# Patient Record
Sex: Male | Born: 1945 | Race: White | Hispanic: No | Smoking: Former smoker
Health system: Southern US, Community
[De-identification: ages and names within clinical notes are randomized; demographics above are authoritative.]

## PROBLEM LIST (undated history)

## (undated) DIAGNOSIS — C61 Malignant neoplasm of prostate: Secondary | ICD-10-CM

## (undated) DIAGNOSIS — I739 Peripheral vascular disease, unspecified: Secondary | ICD-10-CM

## (undated) DIAGNOSIS — I10 Essential (primary) hypertension: Secondary | ICD-10-CM

## (undated) HISTORY — DX: Essential (primary) hypertension: I10

## (undated) HISTORY — PX: PROSTATE SURGERY: SHX751

## (undated) HISTORY — PX: GREEN LIGHT LASER TURP (TRANSURETHRAL RESECTION OF PROSTATE: SHX6260

## (undated) HISTORY — PX: ANKLE SURGERY: SHX546

## (undated) HISTORY — PX: PROSTATE BIOPSY: SHX241

## (undated) HISTORY — PX: CYSTOSCOPY: SUR368

## (undated) HISTORY — PX: TONSILLECTOMY: SUR1361

---

## 2018-10-01 DIAGNOSIS — D485 Neoplasm of uncertain behavior of skin: Secondary | ICD-10-CM | POA: Diagnosis not present

## 2018-10-01 DIAGNOSIS — L821 Other seborrheic keratosis: Secondary | ICD-10-CM | POA: Diagnosis not present

## 2018-10-01 DIAGNOSIS — D224 Melanocytic nevi of scalp and neck: Secondary | ICD-10-CM | POA: Diagnosis not present

## 2018-10-01 DIAGNOSIS — D225 Melanocytic nevi of trunk: Secondary | ICD-10-CM | POA: Diagnosis not present

## 2018-10-15 ENCOUNTER — Encounter: Payer: Self-pay | Admitting: Family Medicine

## 2018-10-15 ENCOUNTER — Ambulatory Visit (INDEPENDENT_AMBULATORY_CARE_PROVIDER_SITE_OTHER): Payer: Self-pay | Admitting: Family Medicine

## 2018-10-15 ENCOUNTER — Other Ambulatory Visit: Payer: Self-pay

## 2018-10-15 VITALS — HR 64 | Resp 12 | Ht 76.0 in | Wt 175.0 lb

## 2018-10-15 DIAGNOSIS — I10 Essential (primary) hypertension: Secondary | ICD-10-CM | POA: Insufficient documentation

## 2018-10-15 MED ORDER — LOSARTAN POTASSIUM 100 MG PO TABS: 100.0000 mg | ORAL_TABLET | Freq: Every day | ORAL | 1 refills | Status: DC

## 2018-10-15 NOTE — Assessment & Plan Note (Signed)
He is not checking BP at home, strongly recommend to start doing so. No changes in current management. His granddaughter prefers not to bring him in for labs, afraid of possible exposure. We discussed some risk of not evaluating renal function and electrolytes for > 1 year. We will plan on checking BMP next visit. Follow-up in 3 to 4 months.

## 2018-10-15 NOTE — Progress Notes (Signed)
HPI:   Mr.Leviathan Janik is a 73 y.o. male, who wants to establish care. Due to mild hearing loss, granddaughter is helping with interrogation.  He recently moved from [place], 09/06/2018.  Former PCP: Dr. Sherrell Puller Last preventive routine visit: 2 years ago.  Chronic medical problems: Hypertension,mild hearing loss,and former smoker. He denies history of diabetes, hyperlipidemia, or CAD.  Hypertension diagnosed 10+ years ago. He is on losartan 100 mg daily. He is taking medication daily, no side effects reported.  Last blood work done 1 to 2 years ago.  Denies severe/frequent headache, visual changes, chest pain, dyspnea, palpitation, claudication, focal weakness, or edema.   Concerns today: Losartan refill.  Review of Systems  Constitutional: Negative for activity change, appetite change, fatigue and fever.  HENT: Negative for nosebleeds and sore throat.   Eyes: Negative for redness and visual disturbance.  Respiratory: Negative for cough, shortness of breath and wheezing.   Cardiovascular: Negative for chest pain, palpitations and leg swelling.  Gastrointestinal: Negative for abdominal pain, nausea and vomiting.  Genitourinary: Negative for decreased urine volume and hematuria.  Neurological: Negative for dizziness, weakness and headaches.    No current outpatient medications on file prior to visit.   No current facility-administered medications on file prior to visit.     Past Medical History:  Diagnosis Date  . Hypertension    No Known Allergies  Family History  Problem Relation Age of Onset  . Diabetes Neg Hx   . Heart disease Neg Hx   . Hyperlipidemia Neg Hx   . Cancer Neg Hx     Social History   Socioeconomic History  . Marital status: Unknown    Spouse name: Not on file  . Number of children: Not on file  . Years of education: Not on file  . Highest education level: Not on file  Occupational History  . Not on file  Social Needs   . Financial resource strain: Not on file  . Food insecurity:    Worry: Not on file    Inability: Not on file  . Transportation needs:    Medical: Not on file    Non-medical: Not on file  Tobacco Use  . Smoking status: Former Smoker    Types: Cigarettes    Start date: 07/25/1959    Last attempt to quit: 10/07/2015    Years since quitting: 3.0  . Smokeless tobacco: Never Used  Substance and Sexual Activity  . Alcohol use: Not Currently  . Drug use: Not Currently  . Sexual activity: Not Currently  Lifestyle  . Physical activity:    Days per week: Not on file    Minutes per session: Not on file  . Stress: Not on file  Relationships  . Social connections:    Talks on phone: Not on file    Gets together: Not on file    Attends religious service: Not on file    Active member of club or organization: Not on file    Attends meetings of clubs or organizations: Not on file    Relationship status: Not on file  Other Topics Concern  . Not on file  Social History Narrative  . Not on file    Vitals:   10/15/18 1203  Pulse: 64  Resp: 12    Body mass index is 21.3 kg/m.  Physical Exam  Constitutional: He is oriented to person, place, and time. He appears well-developed. No distress.  HENT:  Head: Normocephalic and atraumatic.  Eyes: Conjunctivae are normal.  Cardiovascular: Normal rate.  Respiratory: Effort normal. No respiratory distress.  Neurological: He is alert and oriented to person, place, and time.  No focal deficit appreciated.  Skin: No erythema.  Psychiatric: He has a normal mood and affect.  Well groomed, good eye contact.    ASSESSMENT AND PLAN:  Mr. Nithin was seen today for establish care.  Diagnoses and all orders for this visit:  Hypertension, essential, benign He is not checking BP at home, strongly recommend to start doing so. No changes in current management. His granddaughter prefers not to bring him in for labs, afraid of possible exposure. We  discussed some risk of not evaluating renal function and electrolytes for > 1 year. We will plan on checking BMP next visit. Follow-up in 3 to 4 months.    [facility] face to face visit 35 min. Reviewing Hx,med and side effects,and plan of care.    Return in about 4 months (around 02/14/2019) for htn.       Betty G. Martinique, MD  [facility]. [place] office.

## 2018-10-18 ENCOUNTER — Other Ambulatory Visit (INDEPENDENT_AMBULATORY_CARE_PROVIDER_SITE_OTHER): Payer: Medicare Other

## 2018-10-18 ENCOUNTER — Other Ambulatory Visit: Payer: Self-pay | Admitting: *Deleted

## 2018-10-18 ENCOUNTER — Other Ambulatory Visit: Payer: Self-pay

## 2018-10-18 DIAGNOSIS — I10 Essential (primary) hypertension: Secondary | ICD-10-CM

## 2018-10-18 LAB — BASIC METABOLIC PANEL
BUN: 14 mg/dL (ref 6–23)
CALCIUM: 9.6 mg/dL (ref 8.4–10.5)
CO2: 27 mEq/L (ref 19–32)
Chloride: 101 mEq/L (ref 96–112)
Creatinine, Ser: 1.13 mg/dL (ref 0.40–1.50)
GFR: 63.8 mL/min (ref >60.00)
Glucose, Bld: 136 mg/dL — ABNORMAL HIGH (ref 70–99)
Potassium: 4.4 mEq/L (ref 3.5–5.1)
Sodium: 138 mEq/L (ref 135–145)

## 2018-12-04 ENCOUNTER — Telehealth: Payer: Self-pay | Admitting: Family Medicine

## 2018-12-04 NOTE — Telephone Encounter (Signed)
Copied from [place] [id number]. Topic: Quick Communication - See Telephone Encounter >> Dec 04, 2018 11:21 AM Margot Ables wrote: CRM for notification. See Telephone encounter for: 12/04/18. Pt called stating his BP this morning was 164/94 and reports no symptoms or problems. Pt stating he often is around 175/90. He said that he thinks he needs something different than losartan 100mg . He said that he has been doing 2/day but is running out. RX is for 1/day. He is now taking 1/day not to run out. Please advise.

## 2018-12-05 NOTE — Telephone Encounter (Signed)
Patient scheduled ov with pcp on 12/06/2018 to discuss blood pressures and medication.

## 2018-12-06 ENCOUNTER — Other Ambulatory Visit: Payer: Self-pay

## 2018-12-06 ENCOUNTER — Encounter: Payer: Self-pay | Admitting: Family Medicine

## 2018-12-06 ENCOUNTER — Ambulatory Visit (INDEPENDENT_AMBULATORY_CARE_PROVIDER_SITE_OTHER): Payer: Medicare Other | Admitting: Family Medicine

## 2018-12-06 DIAGNOSIS — I10 Essential (primary) hypertension: Secondary | ICD-10-CM | POA: Diagnosis not present

## 2018-12-06 DIAGNOSIS — Z789 Other specified health status: Secondary | ICD-10-CM | POA: Diagnosis not present

## 2018-12-06 DIAGNOSIS — F1020 Alcohol dependence, uncomplicated: Secondary | ICD-10-CM | POA: Insufficient documentation

## 2018-12-06 MED ORDER — AMLODIPINE BESYLATE 5 MG PO TABS: 5.0000 mg | ORAL_TABLET | Freq: Every day | ORAL | 2 refills | Status: DC

## 2018-12-06 NOTE — Assessment & Plan Note (Signed)
Educated about the current recommendations in regard to alcohol consumption, max 8 ounces for men. Recommend stopping beer intake. Discussed adverse effects of alcohol, when taking more than recommended.

## 2018-12-06 NOTE — Progress Notes (Signed)
Virtual Visit via Video Note   I connected with Terry Morales 12/06/18 at  9:30 AM EDT by a video enabled telemedicine application and verified that I am speaking with the correct person using two identifiers.  Location patient: home Location provider:work  Persons participating in the virtual visit: patient, provider  I discussed the limitations of evaluation and management by telemedicine and the availability of in person appointments. The patient expressed understanding and agreed to proceed.   HPI: He is a 73 yo male last seen on 10/15/18. A few days ago he noted SBP high at 202, not sure about DBP. States that he started taking Losartan 100 mg daily and BP improved. Having 160-170's/80-90's. He is not sure about exacerbating factors.  He is following low-salt diet. He is not exercising regularly. He is tolerating medication well. He states that he used to take 3 different antihypertensive medications.  Denies severe/frequent headache, visual changes, chest pain, dyspnea, palpitation, claudication, focal weakness, or edema.  Lab Results  Component Value Date   CREATININE 1.13 10/18/2018   BUN 14 10/18/2018   NA 138 10/18/2018   K 4.4 10/18/2018   CL 101 10/18/2018   CO2 27 10/18/2018    When asked about alcohol intake, he states that on 10/05/18 he stopped "hard alcohol" intake. Currently he is drinking beer 7 oz can x 2-3/day and 3-4 "little" bottles of wine (13.2 oz each one). He has not noted abdominal pain, nausea, vomiting, or urinary symptoms.  ROS: See pertinent positives and negatives per HPI.  Past Medical History:  Diagnosis Date  . Hypertension     Family History  Problem Relation Age of Onset  . Diabetes Neg Hx   . Heart disease Neg Hx   . Hyperlipidemia Neg Hx   . Cancer Neg Hx     Social History   Socioeconomic History  . Marital status: Unknown    Spouse name: Not on file  . Number of children: Not on file  . Years of education: Not on file   . Highest education level: Not on file  Occupational History  . Not on file  Social Needs  . Financial resource strain: Not on file  . Food insecurity:    Worry: Not on file    Inability: Not on file  . Transportation needs:    Medical: Not on file    Non-medical: Not on file  Tobacco Use  . Smoking status: Former Smoker    Types: Cigarettes    Start date: 07/25/1959    Last attempt to quit: 10/07/2015    Years since quitting: 3.1  . Smokeless tobacco: Never Used  Substance and Sexual Activity  . Alcohol use: Not Currently  . Drug use: Not Currently  . Sexual activity: Not Currently  Lifestyle  . Physical activity:    Days per week: Not on file    Minutes per session: Not on file  . Stress: Not on file  Relationships  . Social connections:    Talks on phone: Not on file    Gets together: Not on file    Attends religious service: Not on file    Active member of club or organization: Not on file    Attends meetings of clubs or organizations: Not on file    Relationship status: Not on file  . Intimate partner violence:    Fear of current or ex partner: Not on file    Emotionally abused: Not on file    Physically abused:  Not on file    Forced sexual activity: Not on file  Other Topics Concern  . Not on file  Social History Narrative  . Not on file    Current Outpatient Medications:  .  amLODipine (NORVASC) 5 MG tablet, Take 1 tablet (5 mg total) by mouth daily., Disp: 30 tablet, Rfl: 2 .  losartan (COZAAR) 100 MG tablet, Take 1 tablet (100 mg total) by mouth daily., Disp: 90 tablet, Rfl: 1  EXAM:  VITALS per patient if applicable:BP (!) 564/33   Pulse 72   Resp 12   GENERAL: alert, oriented, appears well and in no acute distress  HEENT: atraumatic, conjunttiva clear, no obvious facial abnormalities on inspection.  NECK: Otherwise normal movements of the head and neck  LUNGS: on inspection no signs of respiratory distress, breathing rate appears normal, no  obvious gross SOB, gasping or wheezing  CV: no obvious cyanosis  MS: moves all visible extremities without noticeable abnormality  PSYCH/NEURO: pleasant and cooperative, no obvious depression or anxiety, speech and thought processing grossly intact  ASSESSMENT AND PLAN:  Discussed the following assessment and plan:  Hypertension, essential, benign Poorly controlled. Continue losartan 100 mg daily. Amlodipine 2.5 mg started today, if BP still => 140/90 in 2 weeks, he was instructed to increase amlodipine dose to 5 mg. We discussed some side effects. Clearly instructed about warning signs. Continue low-salt diet. Follow-up in 6 weeks.  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 Recommend stopping beer intake. Discussed adverse effects of alcohol, when taking more than recommended.     I discussed the assessment and treatment plan with the patient. He was provided an opportunity to ask questions and all were answered. The patient agreed with the plan and demonstrated an understanding of the instructions.   The patient was advised to call back or seek an in-person evaluation if the symptoms worsen or if the condition fails to improve as anticipated.  No follow-ups on file.    Betty Martinique, MD

## 2018-12-06 NOTE — Assessment & Plan Note (Signed)
Poorly controlled. Continue losartan 100 mg daily. Amlodipine 2.5 mg started today, if BP still => 140/90 in 2 weeks, he was instructed to increase amlodipine dose to 5 mg. We discussed some side effects. Clearly instructed about warning signs. Continue low-salt diet. Follow-up in 6 weeks.

## 2018-12-11 ENCOUNTER — Emergency Department (HOSPITAL_COMMUNITY)
Admission: EM | Admit: 2018-12-11 | Discharge: 2018-12-12 | Disposition: A | Discharge status: Home / Self Care | Payer: Medicare Other | Attending: Emergency Medicine | Admitting: Emergency Medicine

## 2018-12-11 ENCOUNTER — Other Ambulatory Visit: Payer: Self-pay

## 2018-12-11 ENCOUNTER — Encounter (HOSPITAL_COMMUNITY): Payer: Self-pay | Admitting: Emergency Medicine

## 2018-12-11 ENCOUNTER — Ambulatory Visit: Payer: Self-pay

## 2018-12-11 DIAGNOSIS — Z87891 Personal history of nicotine dependence: Secondary | ICD-10-CM | POA: Insufficient documentation

## 2018-12-11 DIAGNOSIS — I10 Essential (primary) hypertension: Secondary | ICD-10-CM | POA: Insufficient documentation

## 2018-12-11 DIAGNOSIS — Z79899 Other long term (current) drug therapy: Secondary | ICD-10-CM | POA: Diagnosis not present

## 2018-12-11 DIAGNOSIS — R310 Gross hematuria: Secondary | ICD-10-CM | POA: Insufficient documentation

## 2018-12-11 DIAGNOSIS — R319 Hematuria, unspecified: Secondary | ICD-10-CM | POA: Diagnosis present

## 2018-12-11 LAB — URINALYSIS, ROUTINE W REFLEX MICROSCOPIC

## 2018-12-11 LAB — COMPREHENSIVE METABOLIC PANEL
ALT: 15 U/L (ref 0–44)
AST: 13 U/L — ABNORMAL LOW (ref 15–41)
Albumin: 3.6 g/dL (ref 3.5–5.0)
Alkaline Phosphatase: 75 U/L (ref 38–126)
Anion gap: 10 (ref 5–15)
BUN: 11 mg/dL (ref 8–23)
CO2: 24 mmol/L (ref 22–32)
Calcium: 9.3 mg/dL (ref 8.9–10.3)
Chloride: 102 mmol/L (ref 98–111)
Creatinine, Ser: 1.02 mg/dL (ref 0.61–1.24)
Glucose, Bld: 111 mg/dL — ABNORMAL HIGH (ref 70–99)
Potassium: 4.2 mmol/L (ref 3.5–5.1)
Sodium: 136 mmol/L (ref 135–145)
Total Bilirubin: 0.8 mg/dL (ref 0.3–1.2)
Total Protein: 6.5 g/dL (ref 6.5–8.1)

## 2018-12-11 LAB — CBC WITH DIFFERENTIAL/PLATELET
Abs Immature Granulocytes: 0.04 10*3/uL (ref 0.00–0.07)
Basophils Absolute: 0.1 10*3/uL (ref 0.0–0.1)
Basophils Relative: 1 %
Eosinophils Absolute: 0.2 10*3/uL (ref 0.0–0.5)
Eosinophils Relative: 3 %
HCT: 39 % (ref 39.0–52.0)
Hemoglobin: 12.9 g/dL — ABNORMAL LOW (ref 13.0–17.0)
Immature Granulocytes: 1 %
Lymphocytes Relative: 20 %
Lymphs Abs: 1.4 10*3/uL (ref 0.7–4.0)
MCH: 31.6 pg (ref 26.0–34.0)
MCHC: 33.1 g/dL (ref 30.0–36.0)
MCV: 95.6 fL (ref 80.0–100.0)
Monocytes Absolute: 0.6 10*3/uL (ref 0.1–1.0)
Monocytes Relative: 9 %
Neutro Abs: 4.7 10*3/uL (ref 1.7–7.7)
Neutrophils Relative %: 66 %
Platelets: 362 10*3/uL (ref 150–400)
RBC: 4.08 MIL/uL — ABNORMAL LOW (ref 4.22–5.81)
RDW: 11.9 % (ref 11.5–15.5)
WBC: 7 10*3/uL (ref 4.0–10.5)
nRBC: 0 % (ref 0.0–0.2)

## 2018-12-11 LAB — URINALYSIS, MICROSCOPIC (REFLEX): RBC / HPF: >50 RBC/hpf (ref 0–5)

## 2018-12-11 MED ORDER — LIDOCAINE HCL (PF) 1 % IJ SOLN
5.0000 mL | Freq: Once | INTRAMUSCULAR | Status: AC
  Administered 2018-12-11: 5 mL via INTRADERMAL
  Filled 2018-12-11: qty 5

## 2018-12-11 MED ORDER — CEPHALEXIN 500 MG PO CAPS: 500.0000 mg | ORAL_CAPSULE | Freq: Three times a day (TID) | ORAL | 0 refills | Status: DC

## 2018-12-11 MED ORDER — CEFTRIAXONE SODIUM 1 G IJ SOLR
1.0000 g | Freq: Once | INTRAMUSCULAR | Status: AC
  Administered 2018-12-11: 1 g via INTRAMUSCULAR
  Filled 2018-12-11: qty 10

## 2018-12-11 NOTE — ED Provider Notes (Signed)
TIME SEEN: 11:17 PM  CHIEF COMPLAINT: Gross hematuria  HPI: Patient is a 73 year old male with history of hypertension who presents to the emergency department with gross hematuria.  States this has been ongoing for 2 days.  He is passing clots but feels he is able to empty his bladder.  No flank pain, abdominal pain.  No fevers or chills.  He is not on blood thinners.  He has had this happen once before after a prostate procedure.  No history of prostate or bladder cancer.  No dysuria.  No discharge.  ROS: See HPI Constitutional: no fever  Eyes: no drainage  ENT: no runny nose   Cardiovascular:  no chest pain  Resp: no SOB  GI: no vomiting GU: no dysuria Integumentary: no rash  Allergy: no hives  Musculoskeletal: no leg swelling  Neurological: no slurred speech ROS otherwise negative  PAST MEDICAL HISTORY/PAST SURGICAL HISTORY:  Past Medical History:  Diagnosis Date  . Hypertension     MEDICATIONS:  Prior to Admission medications   Medication Sig Start Date End Date Taking? Authorizing Provider  amLODipine (NORVASC) 5 MG tablet Take 1 tablet (5 mg total) by mouth daily. 12/06/18   Martinique, Betty G, MD  losartan (COZAAR) 100 MG tablet Take 1 tablet (100 mg total) by mouth daily. 10/15/18   Martinique, Betty G, MD    ALLERGIES:  No Known Allergies  SOCIAL HISTORY:  Social History   Tobacco Use  . Smoking status: Former Smoker    Types: Cigarettes    Start date: 07/25/1959    Last attempt to quit: 10/07/2015    Years since quitting: 3.1  . Smokeless tobacco: Never Used  Substance Use Topics  . Alcohol use: Not Currently    FAMILY HISTORY: Family History  Problem Relation Age of Onset  . Diabetes Neg Hx   . Heart disease Neg Hx   . Hyperlipidemia Neg Hx   . Cancer Neg Hx     EXAM: BP (!) 203/89 (BP Location: Right Arm) Comment: Reported to RN  Pulse 71   Temp (!) 97.5 F (36.4 C) (Oral)   Resp 18   Ht 6' (1.829 m)   Wt 81.6 kg   SpO2 100%   BMI 24.41 kg/m   CONSTITUTIONAL: Alert and oriented and responds appropriately to questions. Well-appearing; well-nourished, elderly but appears younger than stated age, in no distress, afebrile, nontoxic, well-hydrated HEAD: Normocephalic EYES: Conjunctivae clear, pupils appear equal, EOMI ENT: normal nose; moist mucous membranes NECK: Supple, no meningismus, no nuchal rigidity, no LAD  CARD: RRR RESP: Normal chest excursion without splinting or tachypnea; no hypoxia or respiratory distress, speaking full sentences ABD/GI: Normal bowel sounds; non-distended; soft, non-tender, no rebound, no guarding, no peritoneal signs, no hepatosplenomegaly BACK:  The back appears normal and is non-tender to palpation, there is no CVA tenderness EXT: Normal ROM in all joints; no major deformity noted SKIN: Normal color for age and race; warm; no rash on exposed skin NEURO: Moves all extremities equally PSYCH: The patient's mood and manner are appropriate. Grooming and personal hygiene are appropriate.  MEDICAL DECISION MAKING: Patient here with painless hematuria.  Labs obtained are unremarkable.  Hemoglobin of 12.9.  Normal creatinine.  No leukocytosis.  He denies any pain.  I have low suspicion for kidney stone.  His urine shows gross blood and many bacteria.  Microscopic exam unable to be performed given urine is grossly bloody.  We will send urine culture but discussed with patient that this may  be a urinary tract infection given many bacteria present.  Will give IM Rocephin here and discharged with Keflex.  He has no previous urine cultures for comparison.  He states he feels that he is able to empty his bladder fine on his own at this time and I do not feel he needs a Foley catheter and does not need to be irrigated.  We have discussed the importance of hydration and frequent emptying of the bladder.  We have discussed that if he has difficulty emptying his bladder secondary to clots that he may need to return to the  emergency department for Foley catheter, irrigation and possible admission.  I recommended follow-up with his primary care physician to recheck his urine after antibiotics complete.  He is also hypertensive here but asymptomatic.  He reports compliance with his medications.  We will have him follow-up his primary doctor to have this rechecked as well.  Also gave urology outpatient follow-up information if symptoms continue despite antibiotics as patient may benefit from cystoscopy.  Patient verbalized understanding and is comfortable with this plan.  At this time, I do not feel there is any life-threatening condition present. I have reviewed and discussed all results (EKG, imaging, lab, urine as appropriate) and exam findings with patient/family. I have reviewed nursing notes and appropriate previous records.  I feel the patient is safe to be discharged home without further emergent workup and can continue workup as an outpatient as needed. Discussed usual and customary return precautions. Patient/family verbalize understanding and are comfortable with this plan.  Outpatient follow-up has been provided as needed. All questions have been answered.      Ward, Delice Bison, DO 12/11/18 2340

## 2018-12-11 NOTE — Telephone Encounter (Signed)
Patient called and says since last night, he's been passing blood clots in his urine. He says he has to go about every 20 minutes work the clots out. He says there is red blood after the clots come out. He denies pain, but says it's an uncomfortable feeling until the clots come out. He denies fever, flank pain. I advised the office is closed and someone from the office will call tomorrow to schedule a visit, care advice given, patient verbalized understanding.  Reason for Disposition . Pain or burning with passing urine  Answer Assessment - Initial Assessment Questions 1. COLOR of URINE: "Describe the color of the urine."  (e.g., tea-colored, pink, red, blood clots, bloody)     Blood clots, red 2. ONSET: "When did the bleeding start?"      Last night 3. EPISODES: "How many times has there been blood in the urine?" or "How many times today?"     Every 20 minutes when feel like have to go all throughout the day 4. PAIN with URINATION: "Is there any pain with passing your urine?" If so, ask: "How bad is the pain?"  (Scale 1-10; or mild, moderate, severe)    - MILD - complains slightly about urination hurting    - MODERATE - interferes with normal activities      - SEVERE - excruciating, unwilling or unable to urinate because of the pain      No pain, but uncomfortable until the clots come out 5. FEVER: "Do you have a fever?" If so, ask: "What is your temperature, how was it measured, and when did it start?"     No 6. ASSOCIATED SYMPTOMS: "Are you passing urine more frequently than usual?"     Yes 7. OTHER SYMPTOMS: "Do you have any other symptoms?" (e.g., back/flank pain, abdominal pain, vomiting)     Frequency 8. PREGNANCY: "Is there any chance you are pregnant?" "When was your last menstrual period?"     N/A  Protocols used: URINE - BLOOD IN-A-AH

## 2018-12-11 NOTE — ED Triage Notes (Signed)
Pt to ED with c/o hematuria since last pm with clots.  St's he has problems urinating at times

## 2018-12-12 LAB — URINE CULTURE: Culture: NO GROWTH

## 2018-12-12 NOTE — Telephone Encounter (Signed)
Patient went to ER last night at 11 pm. Message sent to Dr. Martinique for review.

## 2018-12-20 ENCOUNTER — Ambulatory Visit (INDEPENDENT_AMBULATORY_CARE_PROVIDER_SITE_OTHER): Payer: Medicare Other | Admitting: Family Medicine

## 2018-12-20 ENCOUNTER — Encounter: Payer: Self-pay | Admitting: Family Medicine

## 2018-12-20 ENCOUNTER — Other Ambulatory Visit: Payer: Self-pay

## 2018-12-20 VITALS — BP 150/80 | HR 76 | Temp 98.8°F | Resp 12 | Ht 72.0 in | Wt 188.4 lb

## 2018-12-20 DIAGNOSIS — R310 Gross hematuria: Secondary | ICD-10-CM

## 2018-12-20 DIAGNOSIS — I10 Essential (primary) hypertension: Secondary | ICD-10-CM

## 2018-12-20 LAB — CBC
HCT: 33.7 % — ABNORMAL LOW (ref 39.0–52.0)
Hemoglobin: 11.8 g/dL — ABNORMAL LOW (ref 13.0–17.0)
MCHC: 34.9 g/dL (ref 30.0–36.0)
MCV: 93 fl (ref 78.0–100.0)
Platelets: 415 10*3/uL — ABNORMAL HIGH (ref 150.0–400.0)
RBC: 3.62 Mil/uL — ABNORMAL LOW (ref 4.22–5.81)
RDW: 12.8 % (ref 11.5–15.5)
WBC: 7.6 10*3/uL (ref 4.0–10.5)

## 2018-12-20 LAB — PSA: PSA: 8.41 ng/mL — ABNORMAL HIGH (ref 0.10–4.00)

## 2018-12-20 NOTE — Progress Notes (Signed)
HPI:   Terry Morales is a 73 y.o. male, who is here today to follow on recent ER visit.  He was last seen, virtual visit, 12/06/2018. He was recently evaluated in the ER because of gross hematuria, 12/11/2018. He was discharged on Keflex, completed treatment.  He is reporting history of prostate surgery about 6 to 7 years ago,not sure about patology result.  He states that since prostate surgery occasionally he sees "small clots" in urine. He has not identified exacerbating or alleviating factors.  He denies dysuria, changes in urinary frequency, or nocturia. Negative for rectal pain.  Former smoker and Hx of high alcohol consumption.  Denies nose/gum bleeding,blood in stool,melena,or easier bruising. Negative for fatigue,changes in daily activities, or abnormal wt loss.   Hypertension, currently he is on amlodipine 2.5 mg daily and losartan 100 mg daily,the former one was added last OV. BP readings still elevated: 150-160's/70-80's,see scan BP reading log.   Denies severe/frequent headache, visual changes, chest pain, dyspnea, palpitation, claudication, focal weakness, or edema.  Lab Results  Component Value Date   CREATININE 1.02 12/11/2018   BUN 11 12/11/2018   NA 136 12/11/2018   K 4.2 12/11/2018   CL 102 12/11/2018   CO2 24 12/11/2018    Review of Systems  Constitutional: Negative for appetite change, chills and fever.  Gastrointestinal: Negative for abdominal pain, nausea and vomiting.       Changes in bowel habits.  Genitourinary: Positive for hematuria.  Musculoskeletal: Positive for gait problem.  Neurological: Negative for dizziness and syncope.  Rest pertinent positives and negatives in HPI.  Current Outpatient Medications on File Prior to Visit  Medication Sig Dispense Refill  . amLODipine (NORVASC) 5 MG tablet Take 1 tablet (5 mg total) by mouth daily. 30 tablet 2  . losartan (COZAAR) 100 MG tablet Take 1 tablet (100 mg total) by mouth daily.  90 tablet 1   No current facility-administered medications on file prior to visit.      Past Medical History:  Diagnosis Date  . Hypertension    No Known Allergies  Social History   Socioeconomic History  . Marital status: Widowed    Spouse name: Not on file  . Number of children: Not on file  . Years of education: Not on file  . Highest education level: Not on file  Occupational History  . Not on file  Social Needs  . Financial resource strain: Not on file  . Food insecurity:    Worry: Not on file    Inability: Not on file  . Transportation needs:    Medical: Not on file    Non-medical: Not on file  Tobacco Use  . Smoking status: Former Smoker    Types: Cigarettes    Start date: 07/25/1959    Last attempt to quit: 10/07/2015    Years since quitting: 3.2  . Smokeless tobacco: Never Used  Substance and Sexual Activity  . Alcohol use: Yes  . Drug use: Not Currently  . Sexual activity: Not Currently  Lifestyle  . Physical activity:    Days per week: Not on file    Minutes per session: Not on file  . Stress: Not on file  Relationships  . Social connections:    Talks on phone: Not on file    Gets together: Not on file    Attends religious service: Not on file    Active member of club or organization: Not on file    Attends  meetings of clubs or organizations: Not on file    Relationship status: Not on file  Other Topics Concern  . Not on file  Social History Narrative  . Not on file    Vitals:   12/20/18 1140  BP: (!) 150/80  Pulse: 76  Resp: 12  Temp: 98.8 F (37.1 C)  SpO2: 98%   Body mass index is 25.55 kg/m.   Physical Exam  Nursing note and vitals reviewed. Constitutional: He is oriented to person, place, and time. He appears well-developed and well-nourished. No distress.  HENT:  Head: Normocephalic and atraumatic.  Mouth/Throat: Oropharynx is clear and moist and mucous membranes are normal.  Eyes: Conjunctivae are normal.  Cardiovascular:  Normal rate and regular rhythm.  Murmur (SEM I/VI RUSB) heard. Pulses:      Dorsalis pedis pulses are 2+ on the right side and 2+ on the left side.  Respiratory: Effort normal and breath sounds normal. No respiratory distress.  GI: Soft. He exhibits no mass. There is no hepatomegaly. There is no abdominal tenderness. There is no CVA tenderness.  Musculoskeletal:        General: Edema (2+ pitting LE edema,bilateral.) present.  Lymphadenopathy:    He has no cervical adenopathy.  Neurological: He is alert and oriented to person, place, and time. He has normal strength. No cranial nerve deficit. Gait abnormal.  Mildly unstable gait without assistance.  Skin: Skin is warm. No rash noted. No erythema.  Psychiatric: He has a normal mood and affect.  Well groomed, good eye contact.    ASSESSMENT AND PLAN:  Mr. Young was seen today for er follow-up.  Diagnoses and all orders for this visit: Lab Results  Component Value Date   WBC 7.6 12/20/2018   HGB 11.8 (L) 12/20/2018   HCT 33.7 (L) 12/20/2018   MCV 93.0 12/20/2018   PLT 415.0 (H) 12/20/2018   Lab Results  Component Value Date   PSA 8.41 (H) 12/20/2018     Gross hematuria Possible etiologies discussed. UCx not growth. Clearly instructed about warning signs. Urology evaluation reccommended.  -     PSA -     CBC -     Ambulatory referral to Urology   Hypertension, essential, benign BP readings still elevated. Recommend increasing amlodipine dose from 2.5 mg to 5 mg. Continue losartan 100 mg daily. Continue monitoring BP regularly. Low-salt diet recommended. Follow-up in 3 to 4 months.   Return in about 4 months (around 04/22/2019) for HTN.    Macyn Remmert G. Martinique, MD  [facility]. [place] office.

## 2018-12-20 NOTE — Patient Instructions (Signed)
A few things to remember from today's visit:   Hypertension, essential, benign  Gross hematuria - Plan: PSA, CBC, Ambulatory referral to Urology  Amlodipine increased from 2.5 mg to 5 mg. No changes in losartan. Continue monitoring blood pressure.  And urology appointment will be arranged.  Please be sure medication list is accurate. If a new problem present, please set up appointment sooner than planned today.

## 2018-12-20 NOTE — Assessment & Plan Note (Signed)
BP readings still elevated. Recommend increasing amlodipine dose from 2.5 mg to 5 mg. Continue losartan 100 mg daily. Continue monitoring BP regularly. Low-salt diet recommended. Follow-up in 3 to 4 months.

## 2018-12-30 ENCOUNTER — Telehealth: Payer: Self-pay | Admitting: *Deleted

## 2018-12-30 DIAGNOSIS — R3912 Poor urinary stream: Secondary | ICD-10-CM | POA: Diagnosis not present

## 2018-12-30 DIAGNOSIS — R310 Gross hematuria: Secondary | ICD-10-CM | POA: Diagnosis not present

## 2018-12-30 NOTE — Telephone Encounter (Signed)
Patient called after hours line 12/28/2018. Patient c/o  rash that is spreading everywhere. It's itchy. It started about 3 weeks ago. They're just pink little dots.   Clinic RN called to f/u with patient. Per daughter patient is taking Benadryl and that is seeming to help with rash and itching.

## 2018-12-31 DIAGNOSIS — R310 Gross hematuria: Secondary | ICD-10-CM | POA: Diagnosis not present

## 2018-12-31 DIAGNOSIS — K5790 Diverticulosis of intestine, part unspecified, without perforation or abscess without bleeding: Secondary | ICD-10-CM | POA: Diagnosis not present

## 2019-01-06 ENCOUNTER — Encounter: Payer: Self-pay | Admitting: Family Medicine

## 2019-01-06 ENCOUNTER — Ambulatory Visit: Payer: Self-pay | Admitting: *Deleted

## 2019-01-06 ENCOUNTER — Ambulatory Visit (INDEPENDENT_AMBULATORY_CARE_PROVIDER_SITE_OTHER): Payer: Medicare Other | Admitting: Family Medicine

## 2019-01-06 ENCOUNTER — Other Ambulatory Visit: Payer: Self-pay

## 2019-01-06 VITALS — BP 138/64 | HR 73 | Temp 97.3°F | Wt 189.4 lb

## 2019-01-06 DIAGNOSIS — L309 Dermatitis, unspecified: Secondary | ICD-10-CM | POA: Diagnosis not present

## 2019-01-06 MED ORDER — TRIAMCINOLONE ACETONIDE 0.1 % EX OINT: 1.0000 "application " | TOPICAL_OINTMENT | Freq: Two times a day (BID) | CUTANEOUS | 0 refills | Status: DC

## 2019-01-06 NOTE — Telephone Encounter (Signed)
Rash- benadryl not helping- Patient had reached out on 6/8 with mild rash- at this point patient has open sores- they are not going away. Call to office to schedule appointment to discuss appointment.  Reason for Disposition . Taking new prescription medicine  (Exceptions: finished taking new prescription antibiotic OR questions about flushing from niacin)  Answer Assessment - Initial Assessment Questions 1. APPEARANCE of RASH: "Describe the rash." (e.g., spots, blisters, raised areas, skin peeling, scaly)     Red - open sores 2. SIZE: "How big are the spots?" (e.g., tip of pen, eraser, coin; inches, centimeters)     spotty areas 3. LOCATION: "Where is the rash located?"     Shoulder and chest- now on legs 4. COLOR: "What color is the rash?" (Note: It is difficult to assess rash color in people with darker-colored skin. When this situation occurs, simply ask the caller to describe what they see.)     red 5. ONSET: "When did the rash begin?"     3-4 weeks 6. FEVER: "Do you have a fever?" If so, ask: "What is your temperature, how was it measured, and when did it start?"     no 7. ITCHING: "Does the rash itch?" If so, ask: "How bad is the itch?" (Scale 1-10; or mild, moderate, severe)     Yes- severe 8. CAUSE: "What do you think is causing the rash?"     Possible medication reaction 9. NEW MEDICATION: "What new medication are you taking?" (e.g., name of antibiotic) "When did you start taking this medication?".     Possible response to Amlodipine 10. OTHER SYMPTOMS: "Do you have any other symptoms?" (e.g., sore throat, fever, joint pain)      no 11. PREGNANCY: "Is there any chance you are pregnant?" "When was your last menstrual period?"       n/a  Protocols used: RASH - WIDESPREAD ON DRUGS-A-AH

## 2019-01-06 NOTE — Progress Notes (Signed)
   Subjective:    Patient ID: Terry Morales, male    DOB: [birth year], 73 y.o.   MRN: [id number]  HPI Here for an itchy rash that appeared one month ago. No recent medication changes. He feels fine in general. He is being evaluated by Urology for an elevated PSA and recent hematuria. A recent urine culture was negative.    Review of Systems  Constitutional: Negative.   Respiratory: Negative.   Cardiovascular: Negative.   Skin: Positive for rash.       Objective:   Physical Exam Constitutional:      Appearance: Normal appearance.  Cardiovascular:     Rate and Rhythm: Normal rate and regular rhythm.     Pulses: Normal pulses.     Heart sounds: Normal heart sounds.  Pulmonary:     Effort: Pulmonary effort is normal.     Breath sounds: Normal breath sounds.  Skin:    Comments: There are wide spread areas if macular red scaly patches of skin on the arms, thighs, and trunk    Neurological:     Mental Status: He is alert.           Assessment & Plan:  Eczema, treat with Triamcinolone ointment.  Alysia Penna, MD

## 2019-01-06 NOTE — Telephone Encounter (Signed)
Pt scheduled for office visit

## 2019-01-13 ENCOUNTER — Telehealth: Payer: Self-pay | Admitting: Family Medicine

## 2019-01-13 DIAGNOSIS — R310 Gross hematuria: Secondary | ICD-10-CM | POA: Diagnosis not present

## 2019-01-13 NOTE — Telephone Encounter (Signed)
Patient's rash is getting better slowly, however steroid cream is greasy and problematic so he is wanting a call back to discuss his options.  Patient is wondering if it was possible to take a pill for the rash.

## 2019-01-15 NOTE — Telephone Encounter (Signed)
I do not think we have talked recently about rash but oral steroids are not indicated to treat chronic pruritic rash or acute localized rash because of side effects.  Thanks, BJ

## 2019-01-15 NOTE — Telephone Encounter (Signed)
Spoke with [name] and gave recommendations per Dr. Martinique. Lori scheduled virtual visit to discuss rash on 01/16/2019. Nothing further needed at this time.

## 2019-01-16 ENCOUNTER — Ambulatory Visit (INDEPENDENT_AMBULATORY_CARE_PROVIDER_SITE_OTHER): Payer: Medicare Other | Admitting: Family Medicine

## 2019-01-16 ENCOUNTER — Encounter: Payer: Self-pay | Admitting: Family Medicine

## 2019-01-16 ENCOUNTER — Other Ambulatory Visit: Payer: Self-pay

## 2019-01-16 DIAGNOSIS — L298 Other pruritus: Secondary | ICD-10-CM | POA: Diagnosis not present

## 2019-01-16 MED ORDER — PREDNISONE 20 MG PO TABS: ORAL_TABLET | ORAL | 0 refills | Status: AC

## 2019-01-16 MED ORDER — CLOBETASOL PROPIONATE 0.05 % EX CREA: 1.0000 "application " | TOPICAL_CREAM | Freq: Two times a day (BID) | CUTANEOUS | 1 refills | Status: DC

## 2019-01-16 NOTE — Progress Notes (Signed)
Virtual Visit via Video Note   I connected with Terry Morales and his daughter on 01/16/19 at 11:30 AM EDT by a video enabled telemedicine application and verified that I am speaking with the correct person using two identifiers.  Location patient: home Location provider:home office Persons participating in the virtual visit: patient, provider  I discussed the limitations of evaluation and management by telemedicine and the availability of in person appointments. The patient expressed understanding and agreed to proceed.   HPI: Terry Morales is a 73 yo male with Hx of HTN who is c/o persistent pruritic rash for the past month or so. He denies any new medication, soap, or body product. His daughter states that he was using 2 different detergents,new brand. Rash was initially on abdomen,upper chest,UE's and LE's.  Chest and abdomen greatly better, he sees some lesions intermittently. No known insect bite or outdoor exposures to plants. No sick contact. No Hx of eczema or similar rash in the past.  He was prescribed Triamcinolone oint but he is annoyed by greasy texture. Rash is not tender and no associated edema.  Her daughter mentions that she has chiggers in her yard, she does not think this is causing this rash.  He denies oral lesions/edema,cough, wheezing, dyspnea, abdominal pain, nausea, or vomiting.  ROS: See pertinent positives and negatives per HPI.  Past Medical History:  Diagnosis Date  . Hypertension     Past Surgical History:  Procedure Laterality Date  . PROSTATE SURGERY    . TONSILLECTOMY      Family History  Problem Relation Age of Onset  . Diabetes Neg Hx   . Heart disease Neg Hx   . Hyperlipidemia Neg Hx   . Cancer Neg Hx     Social History   Socioeconomic History  . Marital status: Widowed    Spouse name: Not on file  . Number of children: Not on file  . Years of education: Not on file  . Highest education level: Not on file  Occupational History  .  Not on file  Social Needs  . Financial resource strain: Not on file  . Food insecurity    Worry: Not on file    Inability: Not on file  . Transportation needs    Medical: Not on file    Non-medical: Not on file  Tobacco Use  . Smoking status: Former Smoker    Types: Cigarettes    Start date: 07/25/1959    Quit date: 10/07/2015    Years since quitting: 3.2  . Smokeless tobacco: Never Used  Substance and Sexual Activity  . Alcohol use: Yes  . Drug use: Not Currently  . Sexual activity: Not Currently  Lifestyle  . Physical activity    Days per week: Not on file    Minutes per session: Not on file  . Stress: Not on file  Relationships  . Social Herbalist on phone: Not on file    Gets together: Not on file    Attends religious service: Not on file    Active member of club or organization: Not on file    Attends meetings of clubs or organizations: Not on file    Relationship status: Not on file  . Intimate partner violence    Fear of current or ex partner: Not on file    Emotionally abused: Not on file    Physically abused: Not on file    Forced sexual activity: Not on file  Other Topics  Concern  . Not on file  Social History Narrative  . Not on file     Current Outpatient Medications:  .  amLODipine (NORVASC) 5 MG tablet, Take 1 tablet (5 mg total) by mouth daily., Disp: 30 tablet, Rfl: 2 .  clobetasol cream (TEMOVATE) 1.02 %, Apply 1 application topically 2 (two) times daily. 14 days, Disp: 45 g, Rfl: 1 .  losartan (COZAAR) 100 MG tablet, Take 1 tablet (100 mg total) by mouth daily., Disp: 90 tablet, Rfl: 1 .  predniSONE (DELTASONE) 20 MG tablet, 2 tabs for 5 days, 1 tabs for 3 days, 1/2  tabs for 3 days. Take tables together with breakfast., Disp: 15 tablet, Rfl: 0  EXAM:  VITALS per patient if applicable:N/A  GENERAL: alert, oriented, appears well and in no acute distress  HEENT: atraumatic,normocephalic, conjunctiva clear, no obvious facial  abnormalities on inspection.  LUNGS: on inspection no signs of respiratory distress, breathing rate appears normal, no obvious gross SOB, gasping or wheezing  CV: no obvious cyanosis  SKIN: Papular and some pustular lesions scattered on RLE,medial aspect or thigh and under knee.   PSYCH/NEURO: pleasant and cooperative, no obvious depression or anxiety, speech and thought processing grossly intact  ASSESSMENT AND PLAN:  Discussed the following assessment and plan:  Pruritic erythematous rash - Plan: clobetasol cream (TEMOVATE) 0.05 %, predniSONE (DELTASONE) 20 MG tablet,  We discussed possible etiologies. ? Insect bite. ?Bed bugs.  Improved. Because extension of rash I recommend short course of Prednisone. Side effects discussed. Stop triamcinolone oint and start Clobetasol cream bid for up to 14 days. OTC Sarna lotion. Caution with Benadryl. Instructed about warning signs.   May need derma evaluation if it is persistent.    I discussed the assessment and treatment plan with the patient. He was provided an opportunity to ask questions and all were answered. The patient agreed with the plan and demonstrated an understanding of the instructions.   The patient was advised to call back or seek an in-person evaluation if the symptoms worsen or if the condition fails to improve as anticipated.  Return if symptoms worsen or fail to improve.    Betty Martinique, MD

## 2019-02-19 DIAGNOSIS — R3912 Poor urinary stream: Secondary | ICD-10-CM | POA: Diagnosis not present

## 2019-02-19 DIAGNOSIS — N35013 Post-traumatic anterior urethral stricture: Secondary | ICD-10-CM | POA: Diagnosis not present

## 2019-02-19 DIAGNOSIS — R310 Gross hematuria: Secondary | ICD-10-CM | POA: Diagnosis not present

## 2019-03-07 ENCOUNTER — Other Ambulatory Visit: Payer: Self-pay | Admitting: Family Medicine

## 2019-03-07 DIAGNOSIS — I10 Essential (primary) hypertension: Secondary | ICD-10-CM

## 2019-03-12 ENCOUNTER — Other Ambulatory Visit: Payer: Self-pay | Admitting: Family Medicine

## 2019-03-12 DIAGNOSIS — I10 Essential (primary) hypertension: Secondary | ICD-10-CM

## 2019-06-02 ENCOUNTER — Other Ambulatory Visit: Payer: Self-pay

## 2019-06-02 NOTE — Patient Outreach (Signed)
[facility]) Care Management  06/02/2019  Terry Morales [birth year] [id number]   Medication Adherence call to [name] Hipp[place] Identifiers Verify spoke with patient she is past due on Losartan 100 mg,patient did not want to engage,patient takes his blood pressure medication on time and has medication at this time.Terry Morales is showing past due under [facility].   [facility] Direct Dial [phone]  Fax [phone] [email or web address]

## 2019-06-11 DIAGNOSIS — R3912 Poor urinary stream: Secondary | ICD-10-CM | POA: Diagnosis not present

## 2019-06-12 ENCOUNTER — Encounter: Payer: Self-pay | Admitting: *Deleted

## 2019-06-16 ENCOUNTER — Encounter: Payer: Self-pay | Admitting: Radiation Oncology

## 2019-06-16 ENCOUNTER — Telehealth: Payer: Self-pay | Admitting: Radiation Oncology

## 2019-06-16 NOTE — Telephone Encounter (Signed)
Contacted patient to verify telephone visit for pre reg °

## 2019-06-16 NOTE — Progress Notes (Signed)
GU Location of Tumor / Histology: prostatic adenocarcinoma  If Prostate Cancer, Gleason Score is (4 + 3) and PSA is (7.77). Prostate volume: 55 mL  PSA s/p biopsy down to 6.97.    Terry Morales presented to Dr. Jeffie Pollock on 12/30/2018 with a weak urine stream and moderate to severe LUTS.  Biopsies of prostate (if applicable) revealed:   Past/Anticipated interventions by urology, if any: Greenlight laser procedure. Cystoscopy, dilation, repeat prostate biopsy, CT abdomen pelvis negative, referral for for consideration of brachytherapy.  Past/Anticipated interventions by medical oncology, if any: no  Weight changes, if any: Denies  Bowel/Bladder complaints, if any: IPSS 4. SHIM 1; not active. Reports urinary frequency. Reports nocturia x1. Denies dysuria or hematuria. Denies urinary leakage or incontinence.  Nausea/Vomiting, if any: denies  Pain issues, if any:  denies  SAFETY ISSUES:  Prior radiation? no  Pacemaker/ICD? no  Possible current pregnancy? no, male patient  Is the patient on methotrexate? no  Current Complaints / other details:  73 year old male. Widowed. 2 daughters. Stopped smoking 12/23/2014. Retired Furniture conservator/restorer.  Family history of cancer: Mother - Passed of bone ca. Primary unknown. Brother - alive with hx of rectal ca Another brother - alive with hx of bladder ca Sister - alive with bladder ca

## 2019-06-17 ENCOUNTER — Ambulatory Visit
Admission: RE | Admit: 2019-06-17 | Discharge: 2019-06-17 | Disposition: A | Discharge status: Home / Self Care | Payer: Medicare Other | Source: Ambulatory Visit | Attending: Radiation Oncology | Admitting: Radiation Oncology

## 2019-06-17 ENCOUNTER — Encounter: Payer: Self-pay | Admitting: Radiation Oncology

## 2019-06-17 ENCOUNTER — Other Ambulatory Visit: Payer: Self-pay

## 2019-06-17 VITALS — Ht 72.0 in | Wt 185.0 lb

## 2019-06-17 DIAGNOSIS — C61 Malignant neoplasm of prostate: Secondary | ICD-10-CM | POA: Insufficient documentation

## 2019-06-17 DIAGNOSIS — Z808 Family history of malignant neoplasm of other organs or systems: Secondary | ICD-10-CM | POA: Diagnosis not present

## 2019-06-17 HISTORY — DX: Malignant neoplasm of prostate: C61

## 2019-06-17 NOTE — Progress Notes (Signed)
Radiation Oncology         [phone] ________________________________  Initial outpatient Consultation - Conducted via Telephone due to current COVID-19 concerns for limiting patient exposure  Name: Terry Morales MRN: [id number]  Date: 06/17/2019  DOB: [birth year]  [id number], Terry So, MD  Irine Seal, MD   REFERRING PHYSICIAN: Irine Seal, MD  DIAGNOSIS: 73 y.o. gentleman with Stage T1c adenocarcinoma of the prostate with Gleason score of 4+3, and PSA of 7.77.    ICD-10-CM   1. Malignant neoplasm of prostate ([facility])  C61     HISTORY OF PRESENT ILLNESS: Terry Morales is a 73 y.o. male with a diagnosis of prostate cancer. He has a history of prior TURP for a longstanding history of prostatomegaly with bladder outlet obstruction approximately 15 to 16 years ago while he was still living in [place].  More recently, he presented to the ED on 12/11/2018 with a 2 day history of painless, gross hematuria with clots. He followed up with his PCP, Dr. Martinique, and was treated with keflex. PSA at that time was 8.41. Per prior records, his PSA has been elevated, above 4.0, since at least 2011 but he has not had any prior prostate biopsies.  He was referred for further evaluation with Dr. 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  A repeat PSA on 01/07/2019 was slightly lower but remained elevated nonetheless at 7.77.  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  Regarding the elevated PSA, the patient proceeded to  transrectal ultrasound with 12 biopsies of the prostate on 02/24/2019.  The prostate volume measured 55 cc.  Out of 12 core biopsies, 2 were positive.  The maximum Gleason score was 4+3, and this was seen in left base lateral, with perineural invasion identified. Gleason 3+3 was seen in right apex.  After further discussion with his urologist regarding the pathology from his prostate biopsy, he elected to repeat the PSA in 3 months prior to pursing treatment. Repeat PSA on 06/05/2019 was slightly lower at 6.97.  The patient reviewed the PSA and biopsy results with his urologist and he has kindly been referred today for discussion of potential radiation treatment options.  His daughter [name] is present with him for the telephone consult today.  PREVIOUS RADIATION THERAPY: No  PAST MEDICAL HISTORY:  Past Medical History:  Diagnosis Date   Hypertension    Prostate cancer ([place])       PAST SURGICAL HISTORY: Past Surgical History:  Procedure Laterality Date   ANKLE SURGERY     bilateral   CYSTOSCOPY     with dilation   GREEN LIGHT LASER TURP (TRANSURETHRAL RESECTION OF PROSTATE     PROSTATE BIOPSY     TONSILLECTOMY      FAMILY HISTORY:  Family History  Problem Relation Age of Onset   Bone cancer Mother    Bladder Cancer Sister    Rectal cancer Brother    Bladder Cancer Brother    Diabetes Neg Hx    Heart disease Neg Hx    Hyperlipidemia Neg Hx    Cancer Neg Hx  Breast cancer Neg Hx    Pancreatic cancer Neg Hx     SOCIAL HISTORY:  Social History   Socioeconomic History   Marital status: Widowed    Spouse name: Not on file   Number of children: 2   Years of education: Not on file   Highest education level: Not on file  Occupational History   Occupation: Furniture conservator/restorer    Comment: retired  Scientist, product/process development strain: Not on file   Food insecurity    Worry: Not on file    Inability: Not on Lexicographer needs    Medical: Not  on file    Non-medical: Not on file  Tobacco Use   Smoking status: Former Smoker    Packs/day: 1.00    Years: 60.00    Pack years: 60.00    Types: Cigarettes    Start date: 07/25/1959    Quit date: 10/07/2015    Years since quitting: 3.6   Smokeless tobacco: Never Used  Substance and Sexual Activity   Alcohol use: Yes   Drug use: Not Currently   Sexual activity: Not Currently  Lifestyle   Physical activity    Days per week: Not on file    Minutes per session: Not on file   Stress: Not on file  Relationships   Social connections    Talks on phone: Not on file    Gets together: Not on file    Attends religious service: Not on file    Active member of club or organization: Not on file    Attends meetings of clubs or organizations: Not on file    Relationship status: Not on file   Intimate partner violence    Fear of current or ex partner: Not on file    Emotionally abused: Not on file    Physically abused: Not on file    Forced sexual activity: Not on file  Other Topics Concern   Not on file  Social History Narrative   Not on file    ALLERGIES: Patient has no known allergies.  MEDICATIONS:  Current Outpatient Medications  Medication Sig Dispense Refill   amLODipine (NORVASC) 5 MG tablet TAKE ONE TABLET BY MOUTH DAILY 30 tablet 1   No current facility-administered medications for this encounter.     REVIEW OF SYSTEMS:  On review of systems, the patient reports that he is doing well overall. He denies any chest pain, shortness of breath, cough, fevers, chills, night sweats, unintended weight changes. He denies any bowel disturbances, and denies abdominal pain, nausea or vomiting. He denies any new musculoskeletal or joint aches or pains. His IPSS was 4, indicating mild urinary symptoms. He reports urinary frequency and nocturia x1. His SHIM was 1, indicating he has erectile dysfunction, but he also states he is not sexually active at this time. A complete review  of systems is obtained and is otherwise negative.    PHYSICAL EXAM:  Wt Readings from Last 3 Encounters:  06/17/19 185 lb (83.9 kg)  01/06/19 189 lb 6 oz (85.9 kg)  12/20/18 188 lb 6 oz (85.4 kg)   Temp Readings from Last 3 Encounters:  01/06/19 (!) 97.3 F (36.3 C) (Oral)  12/20/18 98.8 F (37.1 C) (Oral)  12/11/18 (!) 97.5 F (36.4 C) (Oral)   BP Readings from Last 3 Encounters:  01/06/19 138/64  12/20/18 (!) 150/80  12/11/18 (!) 184/85   Pulse Readings from Last 3 Encounters:  01/06/19 73  12/20/18 76  12/11/18 64   Pain Assessment Pain Score: 0-No pain/10  Unable to assess due to telephone consult visit format.   KPS = 90  100 - Normal; no complaints; no evidence of disease. 90   - Able to carry on normal activity; minor Morales or symptoms of disease. 80   - Normal activity with effort; some Morales or symptoms of disease. 11   - Cares for self; unable to carry on normal activity or to do active work. 60   - Requires occasional assistance, but is able to care for most of his personal needs. 50   - Requires considerable assistance and frequent medical care. 75   - Disabled; requires special care and assistance. 83   - Severely disabled; hospital admission is indicated although death not imminent. 78   - Very sick; hospital admission necessary; active supportive treatment necessary. 10   - Moribund; fatal processes progressing rapidly. 0     - Dead  Karnofsky DA, Abelmann [place], Craver LS and Burchenal [facility] [phone]) The use of the nitrogen mustards in the palliative treatment of carcinoma: with particular reference to bronchogenic carcinoma Cancer 1 634-56  LABORATORY DATA:  Lab Results  Component Value Date   WBC 7.6 12/20/2018   HGB 11.8 (L) 12/20/2018   HCT 33.7 (L) 12/20/2018   MCV 93.0 12/20/2018   PLT 415.0 (H) 12/20/2018   Lab Results  Component Value Date   NA 136 12/11/2018   K 4.2 12/11/2018   CL 102 12/11/2018   CO2 24 12/11/2018   Lab Results   Component Value Date   ALT 15 12/11/2018   AST 13 (L) 12/11/2018   ALKPHOS 75 12/11/2018   BILITOT 0.8 12/11/2018     RADIOGRAPHY: No results found.    IMPRESSION/PLAN: This visit was conducted via Telephone to spare the patient unnecessary potential exposure in the healthcare setting during the current COVID-19 pandemic. 1. 73 y.o. gentleman with Stage T1c adenocarcinoma of the prostate with Gleason Score of 4+3, and PSA of 7.77. We discussed the patient's workup and outlined the nature of prostate cancer in this setting. The patient's T stage, Gleason's score, and PSA put him into the unfavorable intermediate risk group. Accordingly, he is eligible for a variety of potential treatment options including brachytherapy, 5.5 weeks of external radiation, or prostatectomy. We discussed the available radiation techniques, and focused on the details and logistics of delivery.  We discussed and outlined the risks, benefits, short and long-term effects associated with radiotherapy and compared and contrasted these with prostatectomy.  We did discuss the increased risk associated with brachytherapy in light of his prior TURP procedure, including but not limited to incontinence and recurrent urethral stricture.  We discussed the role of SpaceOAR in reducing the rectal toxicity associated with radiotherapy.   At the end of the conversation the patient is interested in moving forward with brachytherapy and use of SpaceOAR to reduce rectal toxicity from radiotherapy.  We will share our discussion with Dr. Jeffie Pollock and move forward with scheduling his [facility] planning appointment in the near future.  The patient will be contacted by Romie Jumper, who will be working closely with him to coordinate OR scheduling and pre and post procedure appointments, in the near future.  We will contact the pharmaceutical rep to ensure that [place] is available at the time of procedure.  He will have a prostate MRI following his  post-seed CT SIM to confirm appropriate distribution of the [place].   Given current concerns  for patient exposure during the COVID-19 pandemic, this encounter was conducted via telephone. The patient was notified in advance and was offered a MyChart meeting to allow for face to face communication but unfortunately reported that he did not have the appropriate resources/technology to support such a visit and instead preferred to proceed with telephone consult. The patient has given verbal consent for this type of encounter. The time spent during this encounter was 5 minutes. The attendants for this meeting include Tyler Pita MD, Ashlyn Bruning PA-C, [place], patient Daquawn Hagedorn and his daughter. During the encounter, Tyler Pita MD, Ashlyn Bruning PA-C, and scribe, Wilburn Mylar were located at [place] [place].  Patient Wilkes Tagert and his daughter were located at home.    Nicholos Johns, PA-C    Tyler Pita, MD  [facility] Direct Dial: [phone]   Fax: [phone] [email or web address]   Skype   [email or web address]  This document serves as a record of services personally performed by Tyler Pita, MD and Freeman Caldron, PA-C. It was created on their behalf by Wilburn Mylar, a trained medical scribe. The creation of this record is based on the scribe's personal observations and the provider's statements to them. This document has been checked and approved by the attending provider.

## 2019-06-17 NOTE — Progress Notes (Signed)
See progress note under physician encounter. 

## 2019-06-25 ENCOUNTER — Telehealth: Payer: Self-pay | Admitting: *Deleted

## 2019-06-25 NOTE — Telephone Encounter (Signed)
CALLED PATIENT TO ASK QUESTIONS, SPOKE WITH PATIENT, WILL UPDATE 06-27-19

## 2019-06-30 ENCOUNTER — Telehealth: Payer: Self-pay | Admitting: Medical Oncology

## 2019-06-30 NOTE — Telephone Encounter (Signed)
Spoke with [name] to introduce myself as the prostate nurse navigator and discuss my role. He asked me to contact his daugther with any issues related to his medical care. He has chosen brachytherapy to treat his prostate cancer but surgery has not been scheduled.

## 2019-07-02 ENCOUNTER — Telehealth: Payer: Self-pay | Admitting: Radiation Oncology

## 2019-07-02 NOTE — Telephone Encounter (Signed)
Received voicemail message from [place], patient's daughter, requesting a return call. [name] inquiring about the patient's seed implant appointments. [name] understands Romie Jumper will phone her back.

## 2019-07-10 ENCOUNTER — Other Ambulatory Visit: Payer: Self-pay | Admitting: Family Medicine

## 2019-07-10 DIAGNOSIS — I10 Essential (primary) hypertension: Secondary | ICD-10-CM

## 2019-07-11 ENCOUNTER — Other Ambulatory Visit: Payer: Self-pay | Admitting: Urology

## 2019-07-14 ENCOUNTER — Telehealth: Payer: Self-pay | Admitting: *Deleted

## 2019-07-14 NOTE — Telephone Encounter (Signed)
CALLED PATIENT'S DAUGHTER- LORI HUDSON TO INFORM OF HER DAD'S PRE-SEED APPTS. AND IMPLANT, SPOKE WITH PATIENT'S DAUGHTER- LORI HUDSON AND SHE IS AWARE OF THESE APPTS.

## 2019-07-30 ENCOUNTER — Telehealth: Payer: Self-pay | Admitting: *Deleted

## 2019-07-30 NOTE — Telephone Encounter (Signed)
CALLED PATIENT'S DAUGHTER (LORI HUDSON) TO REMIND OF HER DAD'S PRE-SEED APPTS. FOR 07-31-19, SPOKE WITH PATIENT'S DAUGHTER- LORI HUDSON AND SHE IS AWARE OF THESE APPTS.

## 2019-07-31 ENCOUNTER — Ambulatory Visit
Admission: RE | Admit: 2019-07-31 | Discharge: 2019-07-31 | Disposition: A | Discharge status: Home / Self Care | Payer: Medicare Other | Source: Ambulatory Visit | Attending: Urology | Admitting: Urology

## 2019-07-31 ENCOUNTER — Ambulatory Visit (HOSPITAL_COMMUNITY)
Admission: RE | Admit: 2019-07-31 | Discharge: 2019-07-31 | Disposition: A | Discharge status: Home / Self Care | Payer: Medicare Other | Source: Ambulatory Visit | Attending: Urology | Admitting: Urology

## 2019-07-31 ENCOUNTER — Encounter (HOSPITAL_COMMUNITY)
Admission: RE | Admit: 2019-07-31 | Discharge: 2019-07-31 | Disposition: A | Discharge status: Home / Self Care | Payer: Medicare Other | Source: Ambulatory Visit | Attending: Urology | Admitting: Urology

## 2019-07-31 ENCOUNTER — Other Ambulatory Visit: Payer: Self-pay

## 2019-07-31 ENCOUNTER — Ambulatory Visit
Admission: RE | Admit: 2019-07-31 | Discharge: 2019-07-31 | Disposition: A | Discharge status: Home / Self Care | Payer: Medicare Other | Source: Ambulatory Visit | Attending: Radiation Oncology | Admitting: Radiation Oncology

## 2019-07-31 DIAGNOSIS — C61 Malignant neoplasm of prostate: Secondary | ICD-10-CM | POA: Diagnosis present

## 2019-07-31 DIAGNOSIS — I10 Essential (primary) hypertension: Secondary | ICD-10-CM | POA: Diagnosis not present

## 2019-07-31 NOTE — Progress Notes (Signed)
  Radiation Oncology         [phone]) [phone] ________________________________  Name: Terry Morales MRN: [id number]  Date: 07/31/2019  DOB: 1946-07-10  SIMULATION AND TREATMENT PLANNING NOTE PUBIC ARCH STUDY  [id number], Malka So, MD  [place], Betty G, MD  DIAGNOSIS: 74 y.o. gentleman with Stage T1c adenocarcinoma of the prostate with Gleason score of 4+3, and PSA of 7.77     ICD-10-CM   1. Malignant neoplasm of prostate (Page)  C61     COMPLEX SIMULATION:  The patient presented today for evaluation for possible prostate seed implant. He was brought to the radiation planning suite and placed supine on the CT couch. A 3-dimensional image study set was obtained in upload to the planning computer. There, on each axial slice, I contoured the prostate gland. Then, using three-dimensional radiation planning tools I reconstructed the prostate in view of the structures from the transperineal needle pathway to assess for possible pubic arch interference. In doing so, I did not appreciate any pubic arch interference. Also, the patient's prostate volume was estimated based on the drawn structure. The volume was 55 cc.  Given the pubic arch appearance and prostate volume, patient remains a good candidate to proceed with prostate seed implant. Today, he freely provided informed written consent to proceed.    PLAN: The patient will undergo prostate seed implant.   ________________________________  Sheral Apley. Tammi Klippel, M.D.

## 2019-08-07 ENCOUNTER — Other Ambulatory Visit: Payer: Self-pay | Admitting: Urology

## 2019-08-07 DIAGNOSIS — C61 Malignant neoplasm of prostate: Secondary | ICD-10-CM

## 2019-08-10 ENCOUNTER — Other Ambulatory Visit: Payer: Self-pay | Admitting: Family Medicine

## 2019-08-10 DIAGNOSIS — I10 Essential (primary) hypertension: Secondary | ICD-10-CM

## 2019-08-22 ENCOUNTER — Telehealth: Payer: Self-pay | Admitting: *Deleted

## 2019-08-22 NOTE — Telephone Encounter (Signed)
Called patient to remind of appts. for 08-25-19, spoke with patient's daughterSarita Haver and she is aware of these appts.

## 2019-08-25 ENCOUNTER — Other Ambulatory Visit (HOSPITAL_COMMUNITY)
Admission: RE | Admit: 2019-08-25 | Discharge: 2019-08-25 | Disposition: A | Discharge status: Home / Self Care | Payer: Medicare Other | Source: Ambulatory Visit | Attending: Urology | Admitting: Urology

## 2019-08-25 ENCOUNTER — Encounter (HOSPITAL_BASED_OUTPATIENT_CLINIC_OR_DEPARTMENT_OTHER): Payer: Self-pay | Admitting: Urology

## 2019-08-25 ENCOUNTER — Encounter (HOSPITAL_COMMUNITY)
Admission: RE | Admit: 2019-08-25 | Discharge: 2019-08-25 | Disposition: A | Discharge status: Home / Self Care | Payer: Medicare Other | Source: Ambulatory Visit | Attending: Urology | Admitting: Urology

## 2019-08-25 ENCOUNTER — Other Ambulatory Visit: Payer: Self-pay

## 2019-08-25 DIAGNOSIS — C61 Malignant neoplasm of prostate: Secondary | ICD-10-CM | POA: Diagnosis not present

## 2019-08-25 DIAGNOSIS — Z20822 Contact with and (suspected) exposure to covid-19: Secondary | ICD-10-CM | POA: Diagnosis not present

## 2019-08-25 DIAGNOSIS — I10 Essential (primary) hypertension: Secondary | ICD-10-CM | POA: Diagnosis not present

## 2019-08-25 DIAGNOSIS — Z01812 Encounter for preprocedural laboratory examination: Secondary | ICD-10-CM | POA: Insufficient documentation

## 2019-08-25 DIAGNOSIS — N35911 Unspecified urethral stricture, male, meatal: Secondary | ICD-10-CM | POA: Diagnosis not present

## 2019-08-25 DIAGNOSIS — Z79899 Other long term (current) drug therapy: Secondary | ICD-10-CM | POA: Diagnosis not present

## 2019-08-25 DIAGNOSIS — Z87891 Personal history of nicotine dependence: Secondary | ICD-10-CM | POA: Diagnosis not present

## 2019-08-25 LAB — COMPREHENSIVE METABOLIC PANEL
ALT: 13 U/L (ref 0–44)
AST: 15 U/L (ref 15–41)
Albumin: 4.4 g/dL (ref 3.5–5.0)
Alkaline Phosphatase: 68 U/L (ref 38–126)
Anion gap: 10 (ref 5–15)
BUN: 16 mg/dL (ref 8–23)
CO2: 25 mmol/L (ref 22–32)
Calcium: 9.7 mg/dL (ref 8.9–10.3)
Chloride: 101 mmol/L (ref 98–111)
Creatinine, Ser: 1.15 mg/dL (ref 0.61–1.24)
GFR calc Af Amer: >60 mL/min (ref >60)
GFR calc non Af Amer: >60 mL/min (ref >60)
Glucose, Bld: 113 mg/dL — ABNORMAL HIGH (ref 70–99)
Potassium: 4.6 mmol/L (ref 3.5–5.1)
Sodium: 136 mmol/L (ref 135–145)
Total Bilirubin: 1.1 mg/dL (ref 0.3–1.2)
Total Protein: 7.2 g/dL (ref 6.5–8.1)

## 2019-08-25 LAB — CBC
HCT: 43.5 % (ref 39.0–52.0)
Hemoglobin: 14.7 g/dL (ref 13.0–17.0)
MCH: 32.6 pg (ref 26.0–34.0)
MCHC: 33.8 g/dL (ref 30.0–36.0)
MCV: 96.5 fL (ref 80.0–100.0)
Platelets: 240 10*3/uL (ref 150–400)
RBC: 4.51 MIL/uL (ref 4.22–5.81)
RDW: 12.6 % (ref 11.5–15.5)
WBC: 8.7 10*3/uL (ref 4.0–10.5)
nRBC: 0 % (ref 0.0–0.2)

## 2019-08-25 LAB — APTT: aPTT: 28 seconds (ref 24–36)

## 2019-08-25 LAB — SARS CORONAVIRUS 2 (TAT 6-24 HRS): SARS Coronavirus 2: NEGATIVE

## 2019-08-25 LAB — PROTIME-INR
INR: 0.9 (ref 0.8–1.2)
Prothrombin Time: 12.3 seconds (ref 11.4–15.2)

## 2019-08-25 NOTE — Progress Notes (Signed)
Spoke w/ via phone for pre-op interview---daughter- Terry Morales Lab needs dos----n/a               Lab results------08/25/2019- CBC,CMP,PT/PTT  Chart/epic 07/31/2019- CXR 2 view chart/epic 07/31/2019-EKG chart/epic COVID test ------08/25/2019 Arrive at -------0530 am on 08/28/2019 NPO after ------midnight  Medications to take morning of surgery -----Amlodipine (Norvasc) with sip of water Diabetic medication -----n/a Patient Special Instructions -----instructed daughter, Terry Morales to instruct patient to use one Fleet's enema the morning of surgery before coming to the hospital. Daughter- Terry Morales verbalized understanding. Pre-Op special Istructions ----- Daughter-Lori Hudson states that Patient verbalized understanding of instructions that were given at this phone interview. Daughter , Terry Morales states that Patient denies shortness of breath, chest pain, fever, cough a this phone interview.

## 2019-08-27 ENCOUNTER — Telehealth: Payer: Self-pay | Admitting: *Deleted

## 2019-08-27 MED ORDER — CIPROFLOXACIN IN D5W 400 MG/200ML IV SOLN
400.0000 mg | INTRAVENOUS | Status: AC
  Administered 2019-08-28: 400 mg via INTRAVENOUS
  Filled 2019-08-27 (×2): qty 200

## 2019-08-27 NOTE — Telephone Encounter (Signed)
Called patient to remind of procedure for 08-28-19, spoke with patient and he is aware of this procedure

## 2019-08-27 NOTE — H&P (Signed)
CC: I have prostate cancer.  HPI: Terry Morales is a 74 year-old male established patient who is here evaluation for treatment of prostate cancer.  His prostate cancer was diagnosed 02/24/2019. His PSA at his time of diagnosis was 7.77.   Terry Morales returns today in f/u for his history of prostate cancer found on a biopsy for an elevated PSA of 7.77. He has a Gleason 7(4+3) tumor a the left lateral base and a gleason 6 tumor at the right apex. His PSA is down to 6.97 3 months post biopsy.   History.   Prostate volume 55gm.   Path: T1c N0 Mx Gleason 7(4+3) with 40% involvement of the left base lateral core and Gleason 6 with 10% involvement of the right mid apical core.   [facility] nomogram predicts 49% probability of OCD, 46% ECE, 7% LNI and 6% SVI.   Predict Prostate predicts a 4% mortalilty rate at 10 years and 8% at 15 years with conservative therapy and 2%/4% with radical therapy.   IPSS is 11-13.   SHIM: he is not active.   He had a CT AP for hematuria that was negative for mets.     ALLERGIES: No Allergies    MEDICATIONS: Amlodipine Besylate 5 mg tablet  Benadryl  Multivitamin     GU PSH: Complex Uroflow - 02/19/2019 Cysto Dilate Stricture (M or F) - 02/19/2019 Laser Surgery Prostate Locm 300-399Mg /Ml Iodine,1Ml - 12/31/2018 Prostate Needle Biopsy - 02/24/2019     NON-GU PSH: Surgical Pathology, Gross And Microscopic Examination For Prostate Needle - 02/24/2019     GU PMH: Prostate Cancer, T1c Nx Mx Gleason 7(4+3) unfavorable intermediate risk prostate cancer. I discussed ADT, RALP, Seeds and EXRT with possible ADT. I briefly discussed cryo and HIFU. He is going to review his options and get back to me. I will schedule a 3 month f/u with PSA should he elect surveillance. I think his most appropriate options are radiation and possibly surgery. - 03/06/2019 Elevated PSA - 02/24/2019, His PSA was 8.41. I will try to get prior records from [place] but also repeat it in 2 weeks to see  if it is declining since his prostate was benign on exam. , - 12/30/2018 Anterior urethral stricture, He has a moderate stricture of the fossa navicularis that required dilation today. - 02/19/2019 Gross hematuria, The hematuria appears to have come from prostatic regrowth. - 02/19/2019, He had painless gross hematuria and needs evaluation with a CT and cystoscopy. he will return in about 2 weeks for that and has requested nitrous for the cysto. 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 I will assess his outlet with cystoscopy. - 12/30/2018 Weak Urinary Stream - 12/30/2018    NON-GU PMH: Hypertension    FAMILY HISTORY: 2 daughters - Other   SOCIAL HISTORY: Marital Status: Widowed Preferred Language: English; Race: White Current Smoking Status: Patient does not smoke anymore. Has not smoked since 12/23/2014.   Tobacco Use Assessment Completed: Used Tobacco in last 30 days? Drinks 1 caffeinated drink per day. 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     Notes: 93 pkg year smoker.    REVIEW OF SYSTEMS:    GU Review Male:   Patient reports frequent urination and get up at night to urinate. Patient denies hard to postpone urination, burning/ pain with urination, leakage of urine, stream starts and stops, trouble starting your stream, have to strain to urinate , erection problems, and penile pain.  Gastrointestinal (Upper):   Patient denies nausea, vomiting, and indigestion/ heartburn.  Gastrointestinal (Lower):   Patient denies diarrhea and constipation.  Constitutional:   Patient denies fever, night sweats, weight loss, and fatigue.  Skin:   Patient denies skin rash/ lesion and itching.  Eyes:   Patient denies blurred vision and double vision.  Ears/ Nose/ Throat:   Patient denies sore throat and sinus problems.  Hematologic/Lymphatic:   Patient denies swollen glands and easy bruising.  Cardiovascular:   Patient denies leg swelling and  chest pains.  Respiratory:   Patient denies cough and shortness of breath.  Endocrine:   Patient denies excessive thirst.  Musculoskeletal:   Patient denies back pain and joint pain.  Neurological:   Patient denies headaches and dizziness.  Psychologic:   Patient denies depression and anxiety.   VITAL SIGNS:      06/11/2019 03:25 PM  Weight 180 lb / 81.65 kg  Height 72 in / 182.88 cm  BP 159/77 mmHg  Pulse 67 /min  Temperature 97.7 F / 36.5 C  BMI 24.4 kg/m   PAST DATA REVIEWED:  Source Of History:  Patient  Lab Test Review:   PSA  Records Review:   AUA Symptom Score  Urine Test Review:   Urinalysis   06/05/19 01/07/19 12/20/18 06/01/15 06/19/14 08/30/12 09/23/10 01/31/10  PSA  Total PSA 6.97 ng/mL 7.77 ng/mL 8.41 ng/dl 6.02 ng/dl 5.23 ng/dl 4.6 ng/dl 4.77 ng/dl 4.07 ng/dl  Free PSA  1.93 ng/mL        % Free PSA  25 % PSA          PROCEDURES:          Urinalysis w/Scope Dipstick Dipstick Cont'd Micro  Color: Yellow Bilirubin: Neg mg/dL WBC/hpf: 0 - 5/hpf  Appearance: Clear Ketones: Neg mg/dL RBC/hpf: 0 - 2/hpf  Specific Gravity: 1.020 Blood: Neg ery/uL Bacteria: NS (Not Seen)  pH: 6.0 Protein: 1+ mg/dL Cystals: NS (Not Seen)  Glucose: Neg mg/dL Urobilinogen: 0.2 mg/dL Casts: NS (Not Seen)    Nitrites: Neg Trichomonas: Not Present    Leukocyte Esterase: Neg leu/uL Mucous: Not Present      Epithelial Cells: NS (Not Seen)      Yeast: NS (Not Seen)      Sperm: Not Present    ASSESSMENT:      ICD-10 Details  1 GU:   Prostate Cancer - C61 His PSA is down slightly. I reviewed the options again and he is ready to consider therapy. I am going to have him see Dr. Tammi Klippel to discuss radiation options. I believe he would prefer seed monotherapy.  2   BPH w/LUTS - N40.1 Stable - His IPSS is stable at 11. His prostate volume is 81ml.   3   Weak Urinary Stream - R39.12 Stable  4   Elevated PSA - R97.20 Improving     PLAN:           Schedule Labs: 3 Months - PSA with Reflex   Return Visit/Planned Activity: 3 Months - Office Visit  Return Visit/Planned Activity: Next Available Appointment - Consult             Note: With Dr. Ledon Snare. Call Dtr. with all information [phone], [name] and appt times.           Document Letter(s):  Created for Patient: Clinical Summary

## 2019-08-28 ENCOUNTER — Encounter (HOSPITAL_BASED_OUTPATIENT_CLINIC_OR_DEPARTMENT_OTHER)
Admission: RE | Disposition: A | Discharge status: Home / Self Care | Payer: Self-pay | Source: Ambulatory Visit | Attending: Urology

## 2019-08-28 ENCOUNTER — Ambulatory Visit (HOSPITAL_BASED_OUTPATIENT_CLINIC_OR_DEPARTMENT_OTHER)
Admission: RE | Admit: 2019-08-28 | Discharge: 2019-08-28 | Disposition: A | Discharge status: Home / Self Care | Payer: Medicare Other | Source: Ambulatory Visit | Attending: Urology | Admitting: Urology

## 2019-08-28 ENCOUNTER — Encounter (HOSPITAL_BASED_OUTPATIENT_CLINIC_OR_DEPARTMENT_OTHER): Payer: Self-pay | Admitting: Urology

## 2019-08-28 ENCOUNTER — Ambulatory Visit (HOSPITAL_COMMUNITY): Payer: Medicare Other

## 2019-08-28 ENCOUNTER — Ambulatory Visit (HOSPITAL_BASED_OUTPATIENT_CLINIC_OR_DEPARTMENT_OTHER): Payer: Medicare Other | Admitting: Certified Registered"

## 2019-08-28 DIAGNOSIS — I10 Essential (primary) hypertension: Secondary | ICD-10-CM | POA: Insufficient documentation

## 2019-08-28 DIAGNOSIS — C61 Malignant neoplasm of prostate: Secondary | ICD-10-CM | POA: Diagnosis not present

## 2019-08-28 DIAGNOSIS — N35911 Unspecified urethral stricture, male, meatal: Secondary | ICD-10-CM | POA: Insufficient documentation

## 2019-08-28 DIAGNOSIS — Z79899 Other long term (current) drug therapy: Secondary | ICD-10-CM | POA: Diagnosis not present

## 2019-08-28 DIAGNOSIS — Z87891 Personal history of nicotine dependence: Secondary | ICD-10-CM | POA: Insufficient documentation

## 2019-08-28 HISTORY — PX: RADIOACTIVE SEED IMPLANT: SHX5150

## 2019-08-28 HISTORY — PX: SPACE OAR INSTILLATION: SHX6769

## 2019-08-28 SURGERY — INSERTION, RADIATION SOURCE, PROSTATE
Anesthesia: General | Site: Prostate

## 2019-08-28 MED ORDER — MORPHINE SULFATE (PF) 2 MG/ML IV SOLN
2.0000 mg | INTRAVENOUS | Status: DC | PRN
  Filled 2019-08-28: qty 1

## 2019-08-28 MED ORDER — SODIUM CHLORIDE 0.9% FLUSH
3.0000 mL | Freq: Two times a day (BID) | INTRAVENOUS | Status: DC
  Filled 2019-08-28: qty 3

## 2019-08-28 MED ORDER — SODIUM CHLORIDE 0.9% FLUSH
3.0000 mL | INTRAVENOUS | Status: DC | PRN
  Filled 2019-08-28: qty 3

## 2019-08-28 MED ORDER — ACETAMINOPHEN 160 MG/5ML PO SOLN
325.0000 mg | ORAL | Status: DC | PRN
  Filled 2019-08-28: qty 20.3

## 2019-08-28 MED ORDER — LACTATED RINGERS IV SOLN
INTRAVENOUS | Status: DC
  Filled 2019-08-28: qty 1000

## 2019-08-28 MED ORDER — OXYCODONE HCL 5 MG PO TABS
5.0000 mg | ORAL_TABLET | Freq: Once | ORAL | Status: DC | PRN
  Filled 2019-08-28: qty 1

## 2019-08-28 MED ORDER — PHENYLEPHRINE HCL-NACL 10-0.9 MG/250ML-% IV SOLN
INTRAVENOUS | Status: DC | PRN
  Administered 2019-08-28: 50 ug/min via INTRAVENOUS

## 2019-08-28 MED ORDER — DEXAMETHASONE SODIUM PHOSPHATE 10 MG/ML IJ SOLN
INTRAMUSCULAR | Status: DC | PRN
  Administered 2019-08-28: 10 mg via INTRAVENOUS

## 2019-08-28 MED ORDER — LIDOCAINE 2% (20 MG/ML) 5 ML SYRINGE
INTRAMUSCULAR | Status: DC | PRN
  Administered 2019-08-28: 60 mg via INTRAVENOUS

## 2019-08-28 MED ORDER — OXYCODONE HCL 5 MG PO TABS
5.0000 mg | ORAL_TABLET | ORAL | Status: DC | PRN
  Filled 2019-08-28: qty 2

## 2019-08-28 MED ORDER — ACETAMINOPHEN 325 MG PO TABS
325.0000 mg | ORAL_TABLET | ORAL | Status: DC | PRN
  Filled 2019-08-28: qty 2

## 2019-08-28 MED ORDER — ONDANSETRON HCL 4 MG/2ML IJ SOLN
INTRAMUSCULAR | Status: AC
  Filled 2019-08-28: qty 2

## 2019-08-28 MED ORDER — CIPROFLOXACIN IN D5W 400 MG/200ML IV SOLN
INTRAVENOUS | Status: AC
  Filled 2019-08-28: qty 200

## 2019-08-28 MED ORDER — SUGAMMADEX SODIUM 500 MG/5ML IV SOLN
INTRAVENOUS | Status: AC
  Filled 2019-08-28: qty 5

## 2019-08-28 MED ORDER — MEPERIDINE HCL 25 MG/ML IJ SOLN
6.2500 mg | INTRAMUSCULAR | Status: DC | PRN
  Filled 2019-08-28: qty 1

## 2019-08-28 MED ORDER — HYDROCODONE-ACETAMINOPHEN 5-325 MG PO TABS: 1.0000 | ORAL_TABLET | Freq: Four times a day (QID) | ORAL | 0 refills | Status: DC | PRN

## 2019-08-28 MED ORDER — FENTANYL CITRATE (PF) 100 MCG/2ML IJ SOLN
INTRAMUSCULAR | Status: DC | PRN
  Administered 2019-08-28 (×3): 50 ug via INTRAVENOUS
  Administered 2019-08-28: 100 ug via INTRAVENOUS

## 2019-08-28 MED ORDER — DEXAMETHASONE SODIUM PHOSPHATE 10 MG/ML IJ SOLN
INTRAMUSCULAR | Status: AC
  Filled 2019-08-28: qty 1

## 2019-08-28 MED ORDER — ONDANSETRON HCL 4 MG/2ML IJ SOLN
INTRAMUSCULAR | Status: DC | PRN
  Administered 2019-08-28: 4 mg via INTRAVENOUS

## 2019-08-28 MED ORDER — MIDAZOLAM HCL 2 MG/2ML IJ SOLN
INTRAMUSCULAR | Status: AC
  Filled 2019-08-28: qty 2

## 2019-08-28 MED ORDER — OXYCODONE HCL 5 MG/5ML PO SOLN
5.0000 mg | Freq: Once | ORAL | Status: DC | PRN
  Filled 2019-08-28: qty 5

## 2019-08-28 MED ORDER — PROPOFOL 10 MG/ML IV BOLUS
INTRAVENOUS | Status: AC
  Filled 2019-08-28: qty 20

## 2019-08-28 MED ORDER — MIDAZOLAM HCL 5 MG/5ML IJ SOLN
INTRAMUSCULAR | Status: DC | PRN
  Administered 2019-08-28: 2 mg via INTRAVENOUS

## 2019-08-28 MED ORDER — SODIUM CHLORIDE 0.9 % IV SOLN
INTRAVENOUS | Status: AC | PRN
  Administered 2019-08-28: 1000 mL

## 2019-08-28 MED ORDER — IOHEXOL 300 MG/ML  SOLN
INTRAMUSCULAR | Status: DC | PRN
  Administered 2019-08-28: 7 mL

## 2019-08-28 MED ORDER — SODIUM CHLORIDE (PF) 0.9 % IJ SOLN
INTRAMUSCULAR | Status: DC | PRN
  Administered 2019-08-28: 3 mL

## 2019-08-28 MED ORDER — FENTANYL CITRATE (PF) 100 MCG/2ML IJ SOLN
25.0000 ug | INTRAMUSCULAR | Status: DC | PRN
  Filled 2019-08-28: qty 1

## 2019-08-28 MED ORDER — ONDANSETRON HCL 4 MG/2ML IJ SOLN
4.0000 mg | Freq: Once | INTRAMUSCULAR | Status: DC | PRN
  Filled 2019-08-28: qty 2

## 2019-08-28 MED ORDER — PROPOFOL 10 MG/ML IV BOLUS
INTRAVENOUS | Status: DC | PRN
  Administered 2019-08-28: 140 mg via INTRAVENOUS

## 2019-08-28 MED ORDER — LIDOCAINE 2% (20 MG/ML) 5 ML SYRINGE
INTRAMUSCULAR | Status: AC
  Filled 2019-08-28: qty 5

## 2019-08-28 MED ORDER — SODIUM CHLORIDE 0.9 % IV SOLN
250.0000 mL | INTRAVENOUS | Status: DC | PRN
  Filled 2019-08-28: qty 250

## 2019-08-28 MED ORDER — ACETAMINOPHEN 325 MG PO TABS
650.0000 mg | ORAL_TABLET | ORAL | Status: DC | PRN
  Filled 2019-08-28: qty 2

## 2019-08-28 MED ORDER — ACETAMINOPHEN 650 MG RE SUPP
650.0000 mg | RECTAL | Status: DC | PRN
  Filled 2019-08-28: qty 1

## 2019-08-28 MED ORDER — FLEET ENEMA 7-19 GM/118ML RE ENEM
1.0000 | ENEMA | Freq: Once | RECTAL | Status: DC
  Filled 2019-08-28: qty 1

## 2019-08-28 MED ORDER — FENTANYL CITRATE (PF) 250 MCG/5ML IJ SOLN
INTRAMUSCULAR | Status: AC
  Filled 2019-08-28: qty 5

## 2019-08-28 MED ORDER — LIDOCAINE HCL URETHRAL/MUCOSAL 2 % EX GEL
CUTANEOUS | Status: DC | PRN
  Administered 2019-08-28: 1 via URETHRAL

## 2019-08-28 SURGICAL SUPPLY — 39 items
BAG URINE DRAIN 2000ML AR STRL (UROLOGICAL SUPPLIES) ×6 IMPLANT
BLADE CLIPPER SENSICLIP SURGIC (BLADE) ×3 IMPLANT
CATH FOLEY 2W COUNCIL 5CC 16FR (CATHETERS) ×3 IMPLANT
CATH FOLEY 2WAY SLVR  5CC 14FR (CATHETERS) ×2
CATH FOLEY 2WAY SLVR  5CC 16FR (CATHETERS) ×2
CATH FOLEY 2WAY SLVR 5CC 14FR (CATHETERS) ×1 IMPLANT
CATH FOLEY 2WAY SLVR 5CC 16FR (CATHETERS) ×1 IMPLANT
CATH ROBINSON RED A/P 16FR (CATHETERS) IMPLANT
CATH ROBINSON RED A/P 20FR (CATHETERS) ×3 IMPLANT
CLOTH BEACON ORANGE TIMEOUT ST (SAFETY) ×3 IMPLANT
CONT SPEC 4OZ CLIKSEAL STRL BL (MISCELLANEOUS) ×6 IMPLANT
COVER BACK TABLE 60X90IN (DRAPES) ×3 IMPLANT
COVER MAYO STAND STRL (DRAPES) ×3 IMPLANT
DRSG TEGADERM 4X4.75 (GAUZE/BANDAGES/DRESSINGS) ×3 IMPLANT
DRSG TEGADERM 8X12 (GAUZE/BANDAGES/DRESSINGS) ×6 IMPLANT
GAUZE SPONGE 4X4 12PLY STRL LF (GAUZE/BANDAGES/DRESSINGS) ×3 IMPLANT
GLOVE BIO SURGEON STRL SZ7.5 (GLOVE) IMPLANT
GLOVE BIO SURGEON STRL SZ8 (GLOVE) IMPLANT
GLOVE SURG ORTHO 8.5 STRL (GLOVE) ×3 IMPLANT
GLOVE SURG SS PI 6.5 STRL IVOR (GLOVE) IMPLANT
GLOVE SURG SS PI 8.0 STRL IVOR (GLOVE) ×6 IMPLANT
GOWN STRL REUS W/ TWL XL LVL3 (GOWN DISPOSABLE) ×1 IMPLANT
GOWN STRL REUS W/TWL XL LVL3 (GOWN DISPOSABLE) ×5 IMPLANT
HOLDER FOLEY CATH W/STRAP (MISCELLANEOUS) ×3 IMPLANT
I-SEED AGX100 ×228 IMPLANT
IMPL SPACEOAR SYSTEM 10ML (Spacer) ×1 IMPLANT
IMPLANT SPACEOAR SYSTEM 10ML (Spacer) ×3 IMPLANT
IV NS 1000ML (IV SOLUTION) ×2
IV NS 1000ML BAXH (IV SOLUTION) ×1 IMPLANT
KIT TURNOVER CYSTO (KITS) ×3 IMPLANT
MARKER SKIN DUAL TIP RULER LAB (MISCELLANEOUS) ×3 IMPLANT
PACK CYSTO (CUSTOM PROCEDURE TRAY) ×3 IMPLANT
SURGILUBE 2OZ TUBE FLIPTOP (MISCELLANEOUS) IMPLANT
SUT BONE WAX W31G (SUTURE) IMPLANT
SYR 10ML LL (SYRINGE) IMPLANT
TOWEL OR 17X26 10 PK STRL BLUE (TOWEL DISPOSABLE) ×3 IMPLANT
UNDERPAD 30X30 (UNDERPADS AND DIAPERS) ×6 IMPLANT
WATER STERILE IRR 3000ML UROMA (IV SOLUTION) ×3 IMPLANT
WATER STERILE IRR 500ML POUR (IV SOLUTION) ×3 IMPLANT

## 2019-08-28 NOTE — Op Note (Signed)
PATIENT:  Terry Morales  PRE-OPERATIVE DIAGNOSIS:  1. Adenocarcinoma of the prostate. 2. Membranous and meatal stricture.  POST-OPERATIVE DIAGNOSIS:  Same  PROCEDURE:  Procedure(s): 1. I-125 radioactive seed implantation 2. SpaceOAR implantation. 3.  Cystoscopy with complicated foley placement and urethral dilation.   SURGEON:  Surgeon(s): Irine Seal MD  Radiation oncologist: Dr. Tyler Pita  ANESTHESIA:  General  EBL:  Minimal  DRAINS: 70 French Foley catheter  INDICATION: Terry Morales is a 74 y.o. with Stage T1c, Gleason 7(4+3) prostate cancer who has elected brachytherapy for treatment.  Description of procedure: After informed consent the patient was brought to the major OR, placed on the table and administered general anesthesia. He was then moved to the modified lithotomy position with his perineum perpendicular to the floor. His perineum and genitalia were then sterilely prepped. An official timeout was then performed. An initial attempt at placement of a  4 French Foley catheter was unsuccessful because of meatal narrowing and a 14 fr catheter was also unable to be passed.  I dilated the meatus to 55fr with Lucianne Lei buren sounds but was unable to get a 78fr straight cath or 76fr coude to pass beyond the bulbar urethra.   I then passed the flexible cystoscope and found a mild membranous stricture with a small lateral false passage.   The scope was advanced through the prostate which had bilobar hyperplasia at the apex with minimal obstruction and a laser TUR defect in the proximal prostatic urethra.  The bladder had mild trabeculation and the UO's were normal.   A sensor wire was placed into the bladder and the scope was removed. A 64fr council catheter was then successfully passed to the bladder.  The balloon was filled with dilute contrast and the catheter was placed to straight drainage.   A rectal tube was placed in the rectum and the transrectal ultrasound probe was placed in  the rectum and affixed to the stand. He was then sterilely draped.  The sterile grid was installed.   Anchor needles were then placed.   Real time ultrasonography was used along with the seed planning software spot-pro version 3.1-00. This was used to develop the seed plan including the number of needles as well as number of seeds required for complete and adequate coverage. Real-time ultrasonography was then used along with the previously developed plan  to implant a total of 76 seeds using 28 needles for a target dose of 145 Gy. This proceeded without difficulty or complication.  The anchor needles and guide were removed and the SpaceOAR needle was passed under US guidance into the fat stripe posterior to the prostate with the tip in the midline at mid prostate. A puff of NS confirmed appropriate positioning and the SpaceOAR polymer was then injected over 10 seconds into the space with excellent distribution.     A Foley catheter was then removed as well as the transrectal ultrasound probe and rectal probe. Flexible cystoscopy was then performed using the 17 French flexible scope which demonstrated no active bleeding.  A small clot was aspirated from the bladder.  No seeds were seen.  A foley was not felt to be required.   The cystoscope was then removed.  The drapes were removed.  The perineum was cleaned and dressed.  He was taken out of the lithotomy position and was awakened and taken to recovery room in stable and satisfactory condition. He tolerated procedure well and there were no intraoperative complications.

## 2019-08-28 NOTE — Interval H&P Note (Signed)
History and Physical Interval Note:  08/28/2019 7:15 AM  Terry Morales  has presented today for surgery, with the diagnosis of PROSTATE CANCER.  The various methods of treatment have been discussed with the patient and family. After consideration of risks, benefits and other options for treatment, the patient has consented to  Procedure(s): RADIOACTIVE SEED IMPLANT/BRACHYTHERAPY IMPLANT (N/A) SPACE OAR INSTILLATION (N/A) as a surgical intervention.  The patient's history has been reviewed, patient examined, no change in status, stable for surgery.  I have reviewed the patient's chart and labs.  Questions were answered to the patient's satisfaction.     Irine Seal

## 2019-08-28 NOTE — Discharge Instructions (Addendum)
Brachytherapy for Prostate Cancer, Care After ° °This sheet gives you information about how to care for yourself after your procedure. Your health care provider may also give you more specific instructions. If you have problems or questions, contact your health care provider. °What can I expect after the procedure? °After the procedure, it is common to have: °· Trouble passing urine. °· Blood in the urine or semen. °· Constipation. °· Frequent feeling of an urgent need to urinate. °· Bruising, swelling, and tenderness of the area behind the scrotum (perineum). °· Bloating and gas. °· Fatigue. °· Burning or pain in the rectum. °· Problems getting or keeping an erection (erectile dysfunction). °· Nausea. °Follow these instructions at home: °Managing pain, stiffness, and swelling °· If directed, apply ice to the affected area: °? Put ice in a plastic bag. °? Place a towel between your skin and the bag. °? Leave the ice on for 20 minutes, 2-3 times a day. °· Try not to sit directly on the area behind the scrotum. A soft cushion can help with discomfort. °Activity °· Do not drive for 24 hours if you were given a medicine to help you relax (sedative). °· Do not drive or use heavy machinery while taking prescription pain medicine. °· Rest as told by your health care provider. °· Most people can return to normal activities a few days or weeks after the procedure. Ask your health care provider what activities are safe for you. °Eating and drinking °· Drink enough fluid to keep your urine clear or pale yellow. °· Eat a healthy, balanced diet. This includes lean proteins, whole grains, and plenty of fruits and vegetables. °General instructions °· Take over-the-counter and prescription medicines only as told by your health care provider. °· Keep all follow-up visits as told by your health care provider. This is important. You may still need additional treatment. °· Do not take baths, swim, or use a hot tub until your health  care provider approves. Shower and wash the area behind the scrotum gently. °· Do not have sex for one week after the treatment, or until your health care provider approves. °· If you have permanent, low-dose brachytherapy implants: °? Limit close contact with children and pregnant women for 2 months or as told by your health care provider. This is important because of the radiation that is still active in the prostate. °? You may set off radioactive sensors, such as airport screenings. Ask your health care provider for a document that explains your treatment. °? You may be instructed to use a condom during sex for the first 2 months after low-dose brachytherapy. °Contact a health care provider if: °· You have a fever or chills. °· You do not have a bowel movement for 3-4 days after the procedure. °· You have diarrhea for 3-4 days after the procedure. °· You develop any new symptoms, such as problems with urinating or erectile dysfunction. °· You have abdomen (abdominal) pain. °· You have more blood in your urine. °Get help right away if: °· You cannot urinate. °· There is excessive bleeding from your rectum. °· You have unusual drainage coming from your rectum. °· You have severe pain in the treated area that does not go away with pain medicine. °· You have severe nausea or vomiting. °Summary °· If you have permanent, low-dose brachytherapy implants, limit close contact with children and pregnant women for 2 months or as told by your health care provider. This is important because of the radiation   that is still active in the prostate.  Talk with your health care provider about your risk of brachytherapy side effects, such as erectile dysfunction or urinary problems. Your health care provider will be able to recommend possible treatment options.  Keep all follow-up visits as told by your health care provider. This is important. You may need additional treatment. This information is not intended to replace  advice given to you by your health care provider. Make sure you discuss any questions you have with your health care provider. Document Revised: 06/22/2017 Document Reviewed: 08/11/2016 Elsevier Patient Education  [place] Instructions   Activity:    Rest for the remainder of the day.  Do not drive or operate equipment today.  You may resume normal  activities in a few days as instructed by your physician, without risk of harmful radiation exposure to those around you, provided you follow the time and distance precautions on the Radiation Oncology Instruction Sheet.   Meals: Drink plenty of lipuids and eat light foods, such as gelatin or soup this evening .  You may return to normal meal plan tomorrow.   Special Instruction:   If any seeds are found, use tweezers to pick up seeds and place in a glass container of any kind and bring to your physician's office.  Call your physician if any of these symptoms occur:   Persistent or heavy bleeding  Urine stream diminishes or stops completely after catheter is removed  Fever equal to or greater than 101 degrees F  Cloudy urine with a strong foul odor  Severe pain  You may feel some burning pain and/or hesitancy when you urinate after the catheter is removed.  These symptoms may increase over the next few weeks, but should diminish within forur to six weeks.  Applying moist heat to the lower abdomen or a hot tub bath may help relieve the pain.  If the discomfort becomes severe, please call your physician for additional medications.   Post Anesthesia Home Care Instructions  Activity: Get plenty of rest for the remainder of the day. A responsible individual must stay with you for 24 hours following the procedure.  For the next 24 hours, DO NOT: -Drive a car -Paediatric nurse -Drink alcoholic beverages -Take any medication unless instructed by your physician -Make any legal decisions  or sign important papers.  Meals: Start with liquid foods such as gelatin or soup. Progress to regular foods as tolerated. Avoid greasy, spicy, heavy foods. If nausea and/or vomiting occur, drink only clear liquids until the nausea and/or vomiting subsides. Call your physician if vomiting continues.  Special Instructions/Symptoms: Your throat may feel dry or sore from the anesthesia or the breathing tube placed in your throat during surgery. If this causes discomfort, gargle with warm salt water. The discomfort should disappear within 24 hours.

## 2019-08-28 NOTE — Transfer of Care (Signed)
Immediate Anesthesia Transfer of Care Note  Patient: Terry Morales  Procedure(s) Performed: RADIOACTIVE SEED IMPLANT/BRACHYTHERAPY IMPLANT (N/A Prostate) SPACE OAR INSTILLATION (N/A Prostate)  Patient Location: PACU  Anesthesia Type:General  Level of Consciousness: awake, alert  and oriented  Airway & Oxygen Therapy: Patient Spontanous Breathing and Patient connected to nasal cannula oxygen  Post-op Assessment: Report given to RN and Post -op Vital signs reviewed and stable  Post vital signs: Reviewed and stable  Last Vitals:  Vitals Value Taken Time  BP 176/78 08/28/19 0925  Temp 36.4 C 08/28/19 0925  Pulse 66 08/28/19 0925  Resp 13 08/28/19 0925  SpO2 97 % 08/28/19 0925  Vitals shown include unvalidated device data.  Last Pain:  Vitals:   08/28/19 0606  TempSrc: Oral  PainSc: 0-No pain      Patients Stated Pain Goal: 4 ([id number])  Complications: No apparent anesthesia complications

## 2019-08-28 NOTE — Anesthesia Preprocedure Evaluation (Signed)
Anesthesia Evaluation  Patient identified by MRN, date of birth, ID band Patient awake    Airway Mallampati: I       Dental no notable dental hx. (+) Teeth Intact   Pulmonary former smoker,    Pulmonary exam normal breath sounds clear to auscultation       Cardiovascular hypertension, Pt. on medications Normal cardiovascular exam Rhythm:Regular Rate:Normal     Neuro/Psych negative neurological ROS  negative psych ROS   GI/Hepatic negative GI ROS, Neg liver ROS,   Endo/Other  negative endocrine ROS  Renal/GU negative Renal ROS  negative genitourinary   Musculoskeletal negative musculoskeletal ROS (+)   Abdominal Normal abdominal exam  (+)   Peds  Hematology negative hematology ROS (+)   Anesthesia Other Findings   Reproductive/Obstetrics                             Anesthesia Physical Anesthesia Plan  ASA: II  Anesthesia Plan: General   Post-op Pain Management:    Induction: Intravenous  PONV Risk Score and Plan: 2 and Ondansetron  Airway Management Planned: LMA  Additional Equipment: None  Intra-op Plan:   Post-operative Plan: Extubation in OR  Informed Consent: I have reviewed the patients History and Physical, chart, labs and discussed the procedure including the risks, benefits and alternatives for the proposed anesthesia with the patient or authorized representative who has indicated his/her understanding and acceptance.     Dental advisory given  Plan Discussed with: CRNA  Anesthesia Plan Comments:         Anesthesia Quick Evaluation

## 2019-08-28 NOTE — Anesthesia Procedure Notes (Signed)
Procedure Name: LMA Insertion Date/Time: 08/28/2019 7:41 AM Performed by: Luigi Stuckey D, CRNA Pre-anesthesia Checklist: Patient identified, Emergency Drugs available, Suction available and Patient being monitored Patient Re-evaluated:Patient Re-evaluated prior to induction Oxygen Delivery Method: Circle system utilized Preoxygenation: Pre-oxygenation with 100% oxygen Induction Type: IV induction Ventilation: Mask ventilation without difficulty LMA Size: 4.0 Tube type: Oral Number of attempts: 1 Placement Confirmation: positive ETCO2 and breath sounds checked- equal and bilateral Tube secured with: Tape Dental Injury: Teeth and Oropharynx as per pre-operative assessment

## 2019-08-28 NOTE — Anesthesia Postprocedure Evaluation (Signed)
Anesthesia Post Note  Patient: Terry Morales  Procedure(s) Performed: RADIOACTIVE SEED IMPLANT/BRACHYTHERAPY IMPLANT (N/A Prostate) SPACE OAR INSTILLATION (N/A Prostate)     Patient location during evaluation: PACU Anesthesia Type: General Level of consciousness: sedated Pain management: pain level controlled Vital Signs Assessment: post-procedure vital signs reviewed and stable Respiratory status: spontaneous breathing Cardiovascular status: stable Postop Assessment: no apparent nausea or vomiting Anesthetic complications: no    Last Vitals:  Vitals:   08/28/19 0925 08/28/19 0930  BP: (!) 176/78 (!) 155/75  Pulse: 66 60  Resp: 16 (!) 25  Temp: 36.4 C   SpO2: 96% 97%    Last Pain:  Vitals:   08/28/19 0606  TempSrc: Oral  PainSc: 0-No pain   Pain Goal: Patients Stated Pain Goal: 4 (08/28/19 0606)                 Huston Foley

## 2019-08-30 NOTE — Progress Notes (Signed)
  Radiation Oncology         [phone] ________________________________  Name: Terry Morales MRN: [id number]  Date: 08/30/2019  DOB: 02-19-1946       Prostate Seed Implant  [id number], Malka So, MD  No ref. provider found  DIAGNOSIS: 74 y.o. gentleman with Stage T1c adenocarcinoma of the prostate with Gleason score of 4+3, and PSA of 7.77.    ICD-10-CM   1. Prostate cancer ([place])  C61 DG Chest 2 View    DG Chest 2 View    PROCEDURE: Insertion of radioactive I-125 seeds into the prostate gland.  RADIATION DOSE: 145 Gy, definitive/boost therapy.  TECHNIQUE: [name] was brought to the operating room with the urologist. He was placed in the dorsolithotomy position. He was catheterized and a rectal tube was inserted. The perineum was shaved, prepped and draped. The ultrasound probe was then introduced into the rectum to see the prostate gland.  TREATMENT DEVICE: A needle grid was attached to the ultrasound probe stand and anchor needles were placed.  3D PLANNING: The prostate was imaged in 3D using a sagittal sweep of the prostate probe. These images were transferred to the planning computer. There, the prostate, urethra and rectum were defined on each axial reconstructed image. Then, the software created an optimized 3D plan and a few seed positions were adjusted. The quality of the plan was reviewed using [facility] information for the target and the following two organs at risk:  Urethra and Rectum.  Then the accepted plan was printed and handed off to the radiation therapist.  Under my supervision, the custom loading of the seeds and spacers was carried out and loaded into sealed vicryl sleeves.  These pre-loaded needles were then placed into the needle holder.Marland Kitchen  PROSTATE VOLUME STUDY:  Using transrectal ultrasound the volume of the prostate was verified to be 61.6 cc.  SPECIAL TREATMENT PROCEDURE/SUPERVISION AND HANDLING: The pre-loaded needles were then delivered under sagittal guidance. A  total of 28 needles were used to deposit 76 seeds in the prostate gland. The individual seed activity was 0.577 mCi.  SpaceOAR:  Yes  COMPLEX SIMULATION: At the end of the procedure, an anterior radiograph of the pelvis was obtained to document seed positioning and count. Cystoscopy was performed to check the urethra and bladder.  MICRODOSIMETRY: At the end of the procedure, the patient was emitting 0.21 mR/hr at 1 meter. Accordingly, he was considered safe for hospital discharge.  PLAN: The patient will return to the radiation oncology clinic for post implant CT dosimetry in three weeks.   ________________________________  Sheral Apley Tammi Klippel, M.D.

## 2019-09-12 ENCOUNTER — Telehealth: Payer: Self-pay | Admitting: *Deleted

## 2019-09-12 NOTE — Telephone Encounter (Signed)
CALLED PATIENT'S DAUGHTER- LORI HUDSON TO INFORM THAT POST SEED APPTS. AND MRI HAVE BEEN MOVED TO 09-25-19, SPOKE WITH PATIENT'S DAUGHTER AND SHE IS AWARE OF THESE APPTS. AND IS GOOD WITH THEM

## 2019-09-18 ENCOUNTER — Ambulatory Visit (HOSPITAL_COMMUNITY): Payer: Medicare Other

## 2019-09-18 ENCOUNTER — Ambulatory Visit: Payer: Medicare Other | Admitting: Urology

## 2019-09-18 ENCOUNTER — Ambulatory Visit: Payer: Medicare Other | Admitting: Radiation Oncology

## 2019-09-18 ENCOUNTER — Inpatient Hospital Stay: Admission: RE | Admit: 2019-09-18 | Payer: Self-pay | Source: Ambulatory Visit | Admitting: Urology

## 2019-09-18 DIAGNOSIS — R3912 Poor urinary stream: Secondary | ICD-10-CM | POA: Diagnosis not present

## 2019-09-24 ENCOUNTER — Telehealth: Payer: Self-pay | Admitting: *Deleted

## 2019-09-24 NOTE — Telephone Encounter (Signed)
CALLED PATIENT TO REMIND OF POST SEED APPTS. AND MRI FOR 09-25-19, LVM FOR A RETURN CALL

## 2019-09-25 ENCOUNTER — Other Ambulatory Visit: Payer: Self-pay

## 2019-09-25 ENCOUNTER — Ambulatory Visit (HOSPITAL_COMMUNITY)
Admission: RE | Admit: 2019-09-25 | Discharge: 2019-09-25 | Disposition: A | Discharge status: Home / Self Care | Payer: Medicare Other | Source: Ambulatory Visit | Attending: Urology | Admitting: Urology

## 2019-09-25 ENCOUNTER — Encounter: Payer: Self-pay | Admitting: Medical Oncology

## 2019-09-25 ENCOUNTER — Ambulatory Visit
Admission: RE | Admit: 2019-09-25 | Discharge: 2019-09-25 | Disposition: A | Discharge status: Home / Self Care | Payer: Medicare Other | Source: Ambulatory Visit | Attending: Urology | Admitting: Urology

## 2019-09-25 ENCOUNTER — Encounter: Payer: Self-pay | Admitting: Urology

## 2019-09-25 ENCOUNTER — Ambulatory Visit: Payer: Medicare Other | Admitting: Urology

## 2019-09-25 ENCOUNTER — Ambulatory Visit
Admission: RE | Admit: 2019-09-25 | Discharge: 2019-09-25 | Disposition: A | Discharge status: Home / Self Care | Payer: Medicare Other | Source: Ambulatory Visit | Attending: Radiation Oncology | Admitting: Radiation Oncology

## 2019-09-25 DIAGNOSIS — C61 Malignant neoplasm of prostate: Secondary | ICD-10-CM | POA: Insufficient documentation

## 2019-09-25 NOTE — Progress Notes (Signed)
Radiation Oncology         [phone]) [phone] ________________________________  Name: Terry Morales MRN: [id number]  Date: 09/25/2019  DOB: 22-Jan-1946  Post-Seed Follow-Up Visit Note  CC: Martinique, Betty G, MD  Martinique, Betty G, MD  Diagnosis:   74 y.o. gentleman with Stage T1c adenocarcinoma of the prostate with Gleason score of 4+3, and PSA of 7.77.    ICD-10-CM   1. Malignant neoplasm of prostate ([facility])  C61     Interval Since Last Radiation:  4 weeks 08/28/2019:  Insertion of radioactive I-125 seeds into the prostate gland; 145 Gy, definitive/boost therapy with placement of SpaceOAR gel.  Narrative:  The patient returns today for routine follow-up.  He is complaining of increased urinary frequency and urinary hesitation symptoms. He filled out a questionnaire regarding urinary function today providing and overall IPSS score of 13 characterizing his symptoms as moderate with persistent increased frequency, urgency and nocturia which are all improved since recently starting Flomax daily.  He specifically denies dysuria, gross hematuria, straining to void, incomplete emptying or incontinence.  He had a follow up visit with Azucena Fallen, NP on 09/18/19 and PVR confirmed adequate bladder emptying. He was prescribed Flomax at that time and reports that this has indeed improved his LUTS.  His pre-implant score was 4. He denies any abdominal pain or bowel symptoms and reports that his energy level is essentially unchanged.  He has a good appetite and is maintaining his weight.  Overall, he is quite pleased with his progress to date.  ALLERGIES:  has No Known Allergies.  Meds: Current Outpatient Medications  Medication Sig Dispense Refill  . amLODipine (NORVASC) 5 MG tablet TAKE ONE TABLET BY MOUTH DAILY 30 tablet 1  . HYDROcodone-acetaminophen (NORCO/VICODIN) 5-325 MG tablet Take 1 tablet by mouth every 6 (six) hours as needed for moderate pain. 6 tablet 0   No current facility-administered medications for  this visit.    Physical Findings: In general this is a well appearing Caucasian male in no acute distress. He's alert and oriented x4 and appropriate throughout the examination. Cardiopulmonary assessment is negative for acute distress and he exhibits normal effort.   Lab Findings: Lab Results  Component Value Date   WBC 8.7 08/25/2019   HGB 14.7 08/25/2019   HCT 43.5 08/25/2019   MCV 96.5 08/25/2019   PLT 240 08/25/2019    Radiographic Findings:  Patient underwent CT imaging in our clinic for post implant dosimetry. The CT will be reviewed by Dr. Tammi Klippel to confirm there is an adequate distribution of radioactive seeds throughout the prostate gland and ensure that there are no seeds in or near the rectum. His scheduled for prostate MRI this afternoon at 3pm and those images will be fused with his CT images for further evaluation. We suspect the final radiation plan and dosimetry will show appropriate coverage of the prostate gland. He understands that we will call and inform him of any unexpected findings on further review of his imaging and dosimetry.  Impression/Plan: 74 y.o. gentleman with Stage T1c adenocarcinoma of the prostate with Gleason score of 4+3, and PSA of 7.77. The patient is recovering from the effects of radiation. His urinary symptoms should gradually improve over the next 4-6 months. We talked about this today. He is encouraged by his improvement already and is otherwise pleased with his outcome. We also talked about long-term follow-up for prostate cancer following seed implant. He understands that ongoing PSA determinations and digital rectal exams will help perform surveillance  to rule out disease recurrence. He had a follow up visit with Azucena Fallen, NP on 09/18/19 and was started on Flomax which he is taking as prescribed.  He has a planned follow up appointment scheduled with Dr. Jeffie Pollock on 12/08/19 with repeat PSA to be drawn prior to that visit. He understands what to  expect with his PSA measures. Patient was also educated today about some of the long-term effects from radiation including a small risk for rectal bleeding and possibly erectile dysfunction. We talked about some of the general management approaches to these potential complications. However, I did encourage the patient to contact our office or return at any point if he has questions or concerns related to his previous radiation and prostate cancer.    Nicholos Johns, PA-C

## 2019-09-25 NOTE — Progress Notes (Signed)
Patient reports no dysuria, hematuria, no incontinence episodes or leakage reports frequency and urgency improved with flomax. Nocturia twice improved from 5 times at night patient is mostly satisfied with his urinary condition now. No issue with bowels or bowel movements.

## 2019-09-27 NOTE — Progress Notes (Signed)
  Radiation Oncology         [phone] ________________________________  Name: Terry Morales MRN: [id number]  Date: 09/25/2019  DOB: 1946-01-04  COMPLEX SIMULATION NOTE  NARRATIVE:  The patient was brought to the [name] today following prostate seed implantation approximately one month ago.  Identity was confirmed.  All relevant records and images related to the planned course of therapy were reviewed.  Then, the patient was set-up supine.  CT images were obtained.  The CT images were loaded into the planning software.  Then the prostate and rectum were contoured.  Treatment planning then occurred.  The implanted iodine 125 seeds were identified by the physics staff for projection of radiation distribution  I have requested : 3D Simulation  I have requested a DVH of the following structures: Prostate and rectum.    ________________________________  Sheral Apley Tammi Klippel, M.D.

## 2019-10-01 ENCOUNTER — Encounter: Payer: Self-pay | Admitting: Radiation Oncology

## 2019-10-01 DIAGNOSIS — C61 Malignant neoplasm of prostate: Secondary | ICD-10-CM | POA: Diagnosis not present

## 2019-10-07 NOTE — Progress Notes (Signed)
  Radiation Oncology         [phone]) [phone] ________________________________  Name: Terry Morales MRN: [id number]  Date: 10/01/2019  DOB: [birth year]  3D Planning Note   Prostate Brachytherapy Post-Implant Dosimetry  Diagnosis: 74 y.o. gentleman with Stage T1c adenocarcinoma of the prostate with Gleason score of 4+3, and PSA of 7.77  Narrative: On a previous date, Terry Morales returned following prostate seed implantation for post implant planning. He underwent CT scan complex simulation to delineate the three-dimensional structures of the pelvis and demonstrate the radiation distribution.  Since that time, the seed localization, and complex isodose planning with dose volume histograms have now been completed.  Results:   Prostate Coverage - The dose of radiation delivered to the 90% or more of the prostate gland (D90) was 116% of the prescription dose. This exceeds our goal of greater than 90%. Rectal Sparing - The volume of rectal tissue receiving the prescription dose or higher was 0.06 cc. This falls under our thresholds tolerance of 1.0 cc.  Impression: The prostate seed implant appears to show adequate target coverage and appropriate rectal sparing.  Plan:  The patient will continue to follow with urology for ongoing PSA determinations. I would anticipate a high likelihood for local tumor control with minimal risk for rectal morbidity.  ________________________________  Sheral Apley Tammi Klippel, M.D.

## 2019-10-10 ENCOUNTER — Other Ambulatory Visit: Payer: Self-pay | Admitting: Family Medicine

## 2019-10-10 DIAGNOSIS — I10 Essential (primary) hypertension: Secondary | ICD-10-CM

## 2019-11-08 ENCOUNTER — Other Ambulatory Visit: Payer: Self-pay | Admitting: Family Medicine

## 2019-11-08 DIAGNOSIS — I10 Essential (primary) hypertension: Secondary | ICD-10-CM

## 2019-11-10 ENCOUNTER — Ambulatory Visit (INDEPENDENT_AMBULATORY_CARE_PROVIDER_SITE_OTHER): Payer: Medicare Other | Admitting: Family Medicine

## 2019-11-10 ENCOUNTER — Telehealth: Payer: Self-pay | Admitting: Family Medicine

## 2019-11-10 ENCOUNTER — Other Ambulatory Visit: Payer: Self-pay

## 2019-11-10 ENCOUNTER — Encounter: Payer: Self-pay | Admitting: Family Medicine

## 2019-11-10 VITALS — BP 140/80 | HR 85 | Temp 97.5°F | Resp 16 | Ht 72.0 in | Wt 193.0 lb

## 2019-11-10 DIAGNOSIS — Z6826 Body mass index (BMI) 26.0-26.9, adult: Secondary | ICD-10-CM

## 2019-11-10 DIAGNOSIS — I491 Atrial premature depolarization: Secondary | ICD-10-CM | POA: Diagnosis not present

## 2019-11-10 DIAGNOSIS — I10 Essential (primary) hypertension: Secondary | ICD-10-CM | POA: Diagnosis not present

## 2019-11-10 MED ORDER — AMLODIPINE BESYLATE 5 MG PO TABS: 5.0000 mg | ORAL_TABLET | Freq: Every day | ORAL | 3 refills | Status: DC

## 2019-11-10 NOTE — Telephone Encounter (Signed)
Message Routed to PCP for review and approval. 

## 2019-11-10 NOTE — Telephone Encounter (Signed)
Seen today. Clois Montavon, MD  

## 2019-11-10 NOTE — Telephone Encounter (Signed)
Pt called in wanting a refill on his blood pressure medication and was advised to make an appointment.  Pt was last seen 12/2018 and at that visit he was advised to make an 3 month appointment.  He declined to make an appointment.

## 2019-11-10 NOTE — Assessment & Plan Note (Signed)
BP adequately controlled. Continue amlodipine 5 mg daily. Continue low-salt diet. Recommend checking BP regularly.

## 2019-11-10 NOTE — Progress Notes (Signed)
Mr. Sevastian Zahra is a 74 y.o.male, who is here today to follow on HTN. Last follow up visit: 12/20/18. He was seen on 01/16/19 for acute visit.  No new problems since his last visit. Follows with urologist for prostate cancer.  HTN dx';ed 11+ years ago. Currently on Amlodipine 5 mg daily.  He is taking medications as instructed, no side effects reported.  He has not noted unusual headache, visual changes, exertional chest pain, dyspnea,  focal weakness, or edema. Home BP readings: Has not checked it in a few months.   Lab Results  Component Value Date   CREATININE 1.15 08/25/2019   BUN 16 08/25/2019   NA 136 08/25/2019   K 4.6 08/25/2019   CL 101 08/25/2019   CO2 25 08/25/2019   Noted mild irregular HR. He has had PAC's in the past. He has not felt palpitations.  He does not exercise regularly. Undergoing treatment for prostate cancer.  Review of Systems  Constitutional: Positive for fatigue. Negative for activity change, appetite change and fever.  HENT: Negative for mouth sores, nosebleeds and sore throat.   Eyes: Negative for redness and visual disturbance.  Respiratory: Negative for cough and wheezing.   Gastrointestinal: Negative for abdominal pain, nausea and vomiting.  Endocrine: Negative for cold intolerance and heat intolerance.  Genitourinary: Negative for decreased urine volume and dysuria.  Neurological: Negative for syncope and facial asymmetry.  Rest see pertinent positives and negatives per HPI.  Current Outpatient Medications on File Prior to Visit  Medication Sig Dispense Refill  . tamsulosin (FLOMAX) 0.4 MG CAPS capsule Take 0.4 mg by mouth daily.     No current facility-administered medications on file prior to visit.    Past Medical History:  Diagnosis Date  . Hypertension   . Prostate cancer ([place])     No Known Allergies  Social History   Socioeconomic History  . Marital status: Widowed    Spouse name: Not on file  . Number of  children: 2  . Years of education: Not on file  . Highest education level: Not on file  Occupational History  . Occupation: Furniture conservator/restorer    Comment: retired  Tobacco Use  . Smoking status: Former Smoker    Packs/day: 1.00    Years: 60.00    Pack years: 60.00    Types: Cigarettes    Start date: 07/25/1959    Quit date: 10/07/2015    Years since quitting: 4.0  . Smokeless tobacco: Never Used  Substance and Sexual Activity  . Alcohol use: Yes    Comment: occassionally  . Drug use: Not Currently  . Sexual activity: Not Currently  Other Topics Concern  . Not on file  Social History Narrative  . Not on file   Social Determinants of Health   Financial Resource Strain:   . Difficulty of Paying Living Expenses:   Food Insecurity:   . Worried About Charity fundraiser in the Last Year:   . Arboriculturist in the Last Year:   Transportation Needs:   . Film/video editor (Medical):   Marland Kitchen Lack of Transportation (Non-Medical):   Physical Activity:   . Days of Exercise per Week:   . Minutes of Exercise per Session:   Stress:   . Feeling of Stress :   Social Connections:   . Frequency of Communication with Friends and Family:   . Frequency of Social Gatherings with Friends and Family:   . Attends Religious Services:   .  Active Member of Clubs or Organizations:   . Attends Archivist Meetings:   Marland Kitchen Marital Status:     Vitals:   11/10/19 1604  BP: 140/80  Pulse: 85  Resp: 16  Temp: (!) 97.5 F (36.4 C)  SpO2: 97%   Body mass index is 26.18 kg/m.  Physical Exam  Nursing note and vitals reviewed. Constitutional: He is oriented to person, place, and time. He appears well-developed. No distress.  HENT:  Head: Normocephalic and atraumatic.  Mouth/Throat: Oropharynx is clear and moist and mucous membranes are normal.  Eyes: Pupils are equal, round, and reactive to light. Conjunctivae are normal.  Cardiovascular: Normal rate. An irregular rhythm present.  Murmur  (soft SEM RUSB) heard. DP and PT pulses present, bilateral.  Respiratory: Effort normal and breath sounds normal. No respiratory distress.  GI: Soft. He exhibits no mass. There is no hepatomegaly. There is no abdominal tenderness.  Musculoskeletal:        General: Edema (Trace pitting LE edema,bilateral) present.  Lymphadenopathy:    He has no cervical adenopathy.  Neurological: He is alert and oriented to person, place, and time. He has normal strength. No cranial nerve deficit. Gait normal.  Skin: Skin is warm. No rash noted. No erythema.  Psychiatric: His affect is blunt.  Well groomed, good eye contact.    ASSESSMENT AND PLAN:   Mr. Estella was seen today for follow-up.  Diagnoses and all orders for this visit:  Body mass index 26.0-26.9, adult Healthful diet and low impact physical activity recommended for prevention of obesity.  PAC (premature atrial contraction) Asymptomatic. He is not interested in having EKG today. Instructed about warning signs.  Hypertension, essential, benign BP adequately controlled. Continue amlodipine 5 mg daily. Continue low-salt diet. Recommend checking BP regularly.   He would like to continue following annually.  Return in 1 year (on 11/09/2020) for CPE and f/u.   Michaelia Beilfuss G. Martinique, MD  [facility]. [place] office.   A few things to remember from today's visit:   Hypertension, essential, benign - Plan: amLODipine (NORVASC) 5 MG tablet  Please check blood pressure regularly if we are going to continue following annually. Low salt diet. No changes today.  Consider arranging a Medicare visit, this can be virtually.  Please be sure medication list is accurate. If a new problem present, please set up appointment sooner than planned today.

## 2019-11-10 NOTE — Patient Instructions (Addendum)
A few things to remember from today's visit:   Hypertension, essential, benign - Plan: amLODipine (NORVASC) 5 MG tablet  Please check blood pressure regularly if we are going to continue following annually. Low salt diet. No changes today.  Consider arranging a Medicare visit, this can be virtually.  Please be sure medication list is accurate. If a new problem present, please set up appointment sooner than planned today.

## 2019-11-12 ENCOUNTER — Other Ambulatory Visit: Payer: Self-pay | Admitting: Family Medicine

## 2019-11-12 DIAGNOSIS — I10 Essential (primary) hypertension: Secondary | ICD-10-CM

## 2019-12-15 LAB — PSA: PSA: 1.68

## 2019-12-19 ENCOUNTER — Encounter: Payer: Self-pay | Admitting: Family Medicine

## 2020-04-03 IMAGING — CR DG CHEST 2V
2 series · 2 of 2 positions shown · non-contrast
Comparison: None.

CLINICAL DATA: Preop prostate surgery history of hypertension ex
smoker

EXAM:
CHEST - 2 VIEW

[w chest pa]
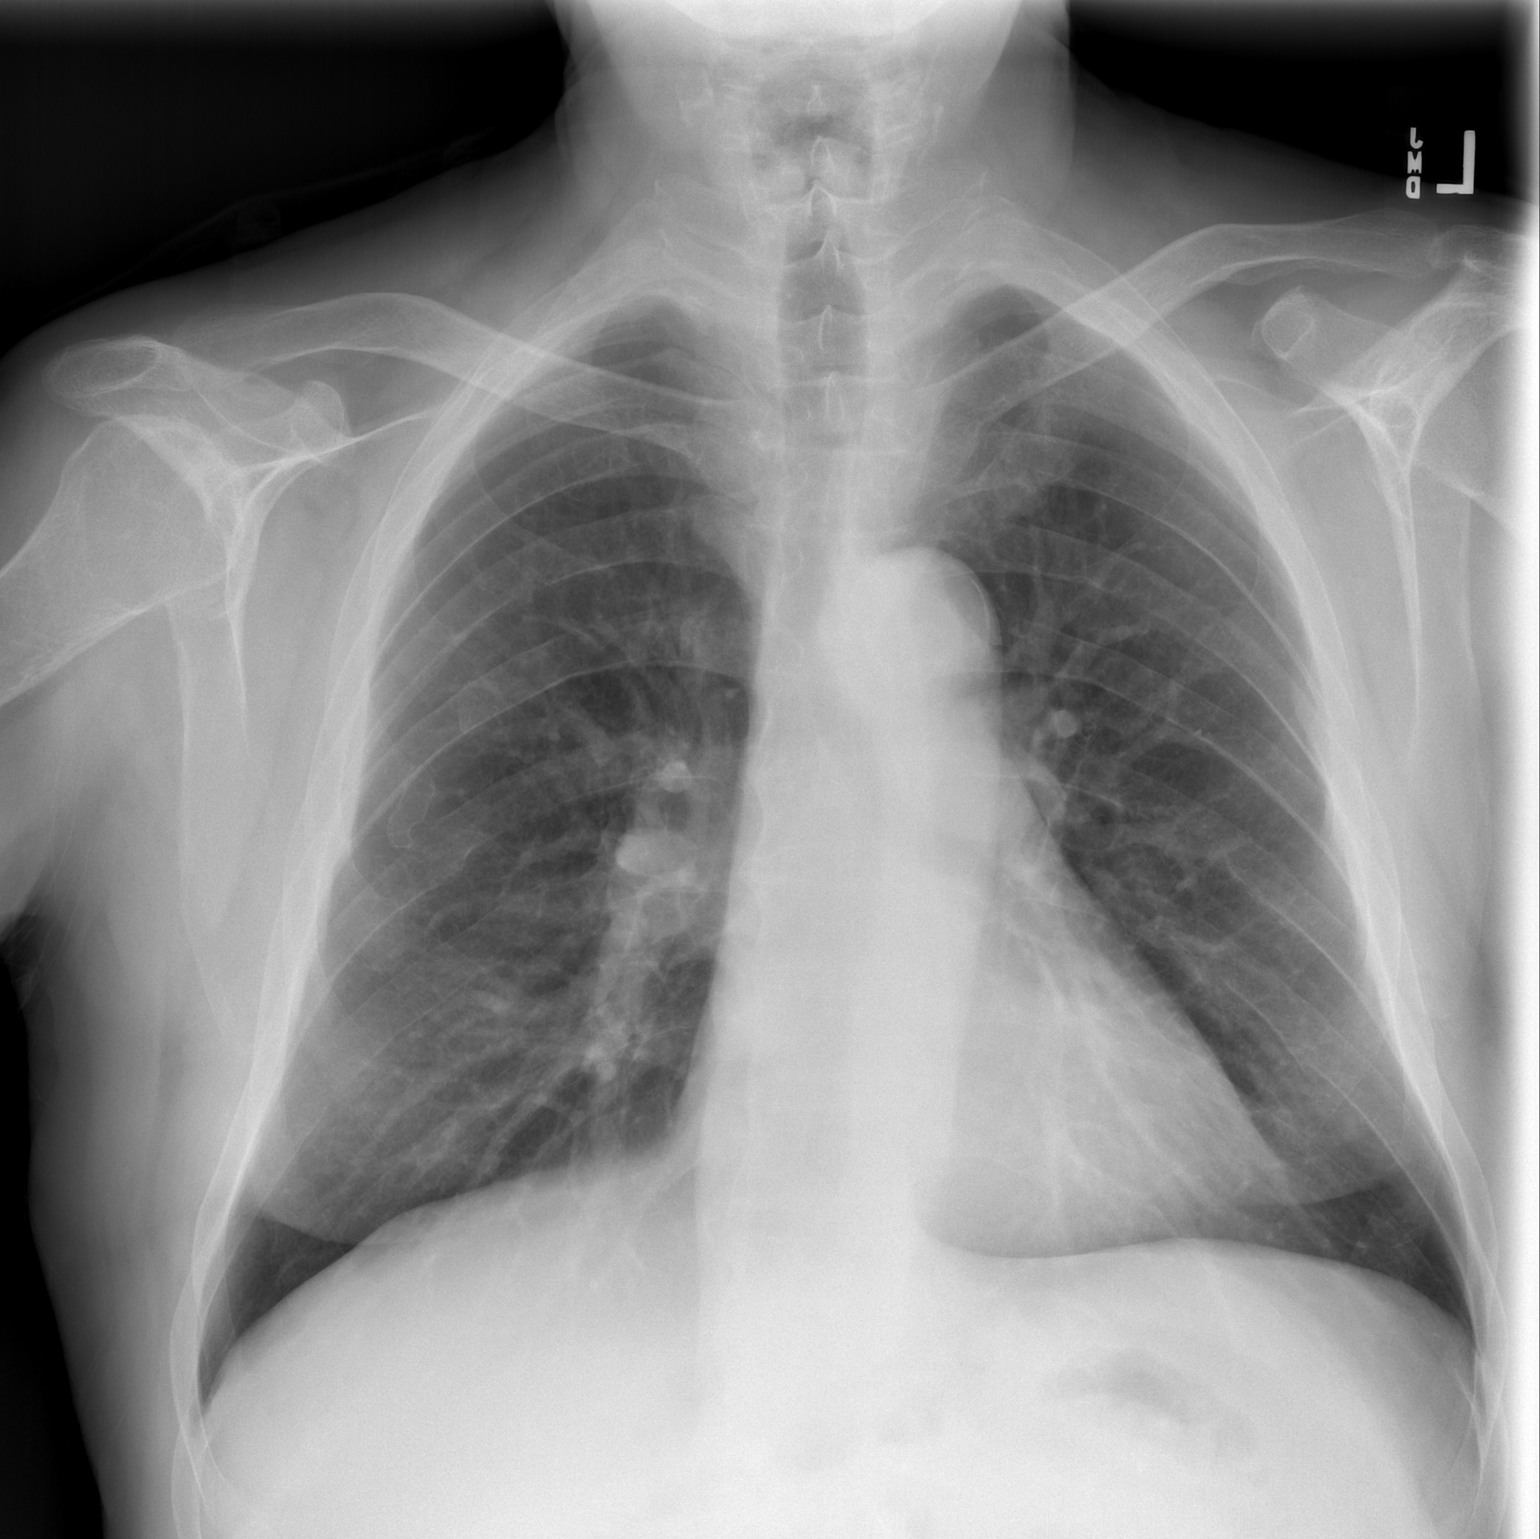

[w chest lat]
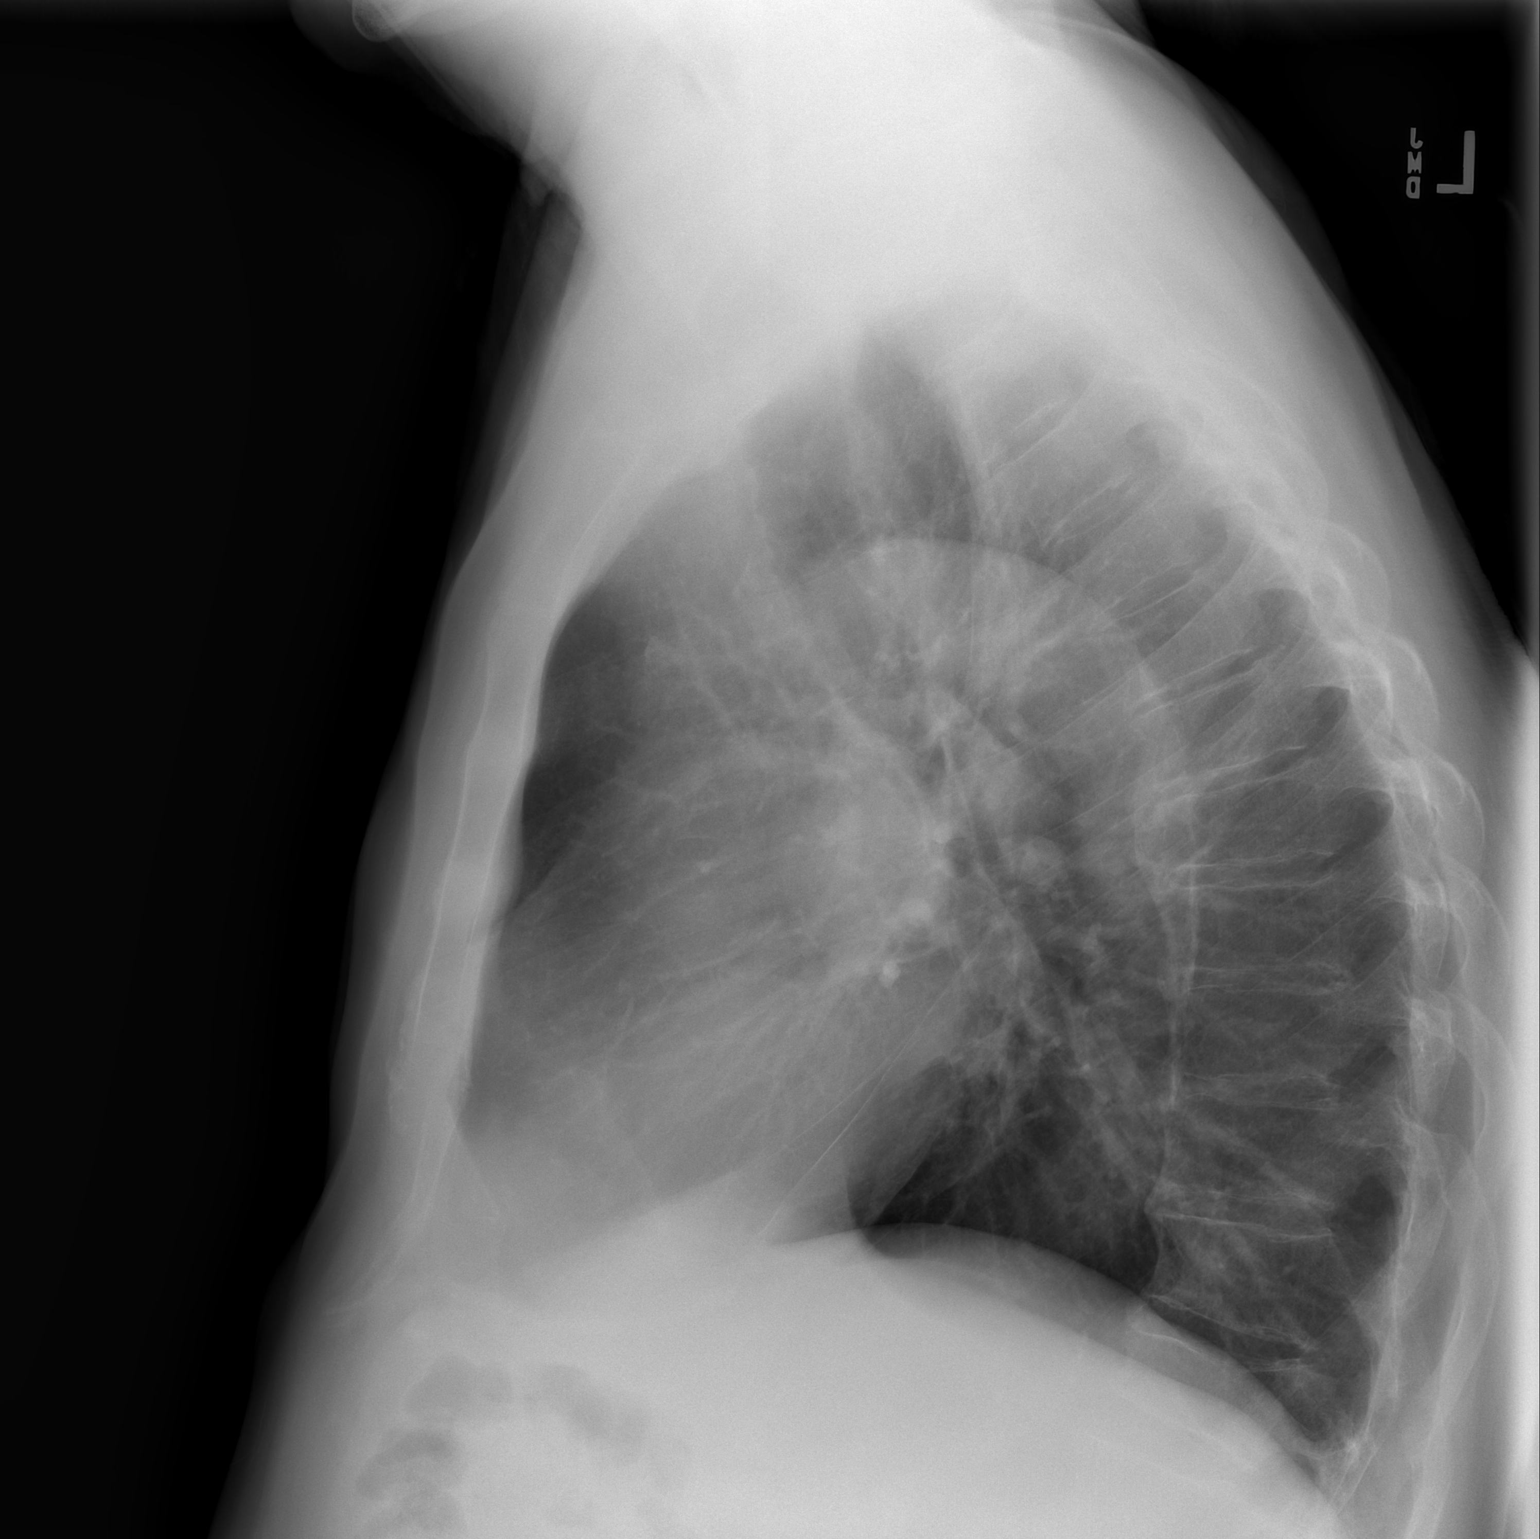

[2 of 2 positions shown; findings below may reference images not displayed]

FINDINGS: The heart size and mediastinal contours are within normal limits.
Both lungs are clear. Aortic atherosclerosis. Degenerative changes
of the spine.
IMPRESSION: No active cardiopulmonary disease.

## 2020-06-14 DIAGNOSIS — R3915 Urgency of urination: Secondary | ICD-10-CM | POA: Diagnosis not present

## 2020-07-21 ENCOUNTER — Telehealth: Payer: Self-pay

## 2020-07-21 NOTE — Telephone Encounter (Signed)
-----   Message from Betty G [place], MD sent at 07/20/2020  5:32 PM EST ----- Regarding: Urine Urologist was concerned about protein in urine. Can you please ask him if he can bring urine sample for UA and microalb/cr ration. Thanks, BJ

## 2020-07-30 ENCOUNTER — Telehealth: Payer: Self-pay | Admitting: Family Medicine

## 2020-07-30 NOTE — Telephone Encounter (Signed)
Tried calling patient to  schedule Medicare Annual Wellness Visit (AWV) either virtually or in office.  No answer    Last AWV ; please schedule at anytime with [facility] Nurse Health Advisor 1 or 2   This should be a 45 minute visit. 

## 2020-08-02 ENCOUNTER — Other Ambulatory Visit: Payer: Self-pay

## 2020-08-02 DIAGNOSIS — R809 Proteinuria, unspecified: Secondary | ICD-10-CM

## 2020-08-02 NOTE — Telephone Encounter (Signed)
I spoke with pt. Orders entered & appt made for tomorrow at 2pm with the lab.

## 2020-08-02 NOTE — Telephone Encounter (Signed)
I spoke with pt, but he was driving, so I told pt I would call him back this afternoon.

## 2020-08-03 ENCOUNTER — Other Ambulatory Visit (INDEPENDENT_AMBULATORY_CARE_PROVIDER_SITE_OTHER): Payer: Medicare Other

## 2020-08-03 DIAGNOSIS — R809 Proteinuria, unspecified: Secondary | ICD-10-CM

## 2020-08-03 LAB — URINALYSIS, ROUTINE W REFLEX MICROSCOPIC
Bilirubin Urine: NEGATIVE
Hgb urine dipstick: NEGATIVE
Ketones, ur: NEGATIVE
Leukocytes,Ua: NEGATIVE
Nitrite: NEGATIVE
RBC / HPF: NONE SEEN (ref 0–?)
Specific Gravity, Urine: >=1.03 — AB (ref 1.000–1.030)
Urine Glucose: NEGATIVE
Urobilinogen, UA: 0.2 (ref 0.0–1.0)
pH: 6 (ref 5.0–8.0)

## 2020-08-03 LAB — MICROALBUMIN / CREATININE URINE RATIO
Creatinine,U: 131.3 mg/dL
Microalb Creat Ratio: 6.5 mg/g (ref 0.0–30.0)
Microalb, Ur: 8.6 mg/dL — ABNORMAL HIGH (ref 0.0–1.9)

## 2020-08-13 ENCOUNTER — Telehealth: Payer: Self-pay

## 2020-08-13 NOTE — Telephone Encounter (Signed)
I tried contacting patient about his handicap placard, unable to leave a voicemail.  We need a follow up visit as Dr. Martinique doesn't have documentation of a reason to select for the form.

## 2020-08-13 NOTE — Telephone Encounter (Signed)
I spoke with pt's daughter. The reason for pt's handicap placard is due to the plates and stents from his surgery years ago.

## 2020-10-11 NOTE — Progress Notes (Deleted)
Subjective:   Terry Morales is a 75 y.o. male who presents for an Initial Medicare Annual Wellness Visit.  Review of Systems    N/A        Objective:    There were no vitals filed for this visit. There is no height or weight on file to calculate BMI.  Advanced Directives 09/25/2019 08/28/2019 06/17/2019 12/11/2018  Does Patient Have a Medical Advance Directive? No Yes Yes Yes  Type of Advance Directive - - [place];Living will Living will;Healthcare Power of Attorney  Does patient want to make changes to medical advance directive? No - Patient declined No - Guardian declined No - Patient declined -  Copy of [place] in Chart? - - No - copy requested No - copy requested  Would patient like information on creating a medical advance directive? No - Guardian declined - - -    Current Medications (verified) Outpatient Encounter Medications as of 10/12/2020  Medication Sig  . amLODipine (NORVASC) 5 MG tablet Take 1 tablet (5 mg total) by mouth daily. Needs appointment for further refills  . tamsulosin (FLOMAX) 0.4 MG CAPS capsule Take 0.4 mg by mouth daily.   No facility-administered encounter medications on file as of 10/12/2020.    Allergies (verified) Patient has no known allergies.   History: Past Medical History:  Diagnosis Date  . Hypertension   . Prostate cancer [facility])    Past Surgical History:  Procedure Laterality Date  . ANKLE SURGERY     bilateral  . CYSTOSCOPY     with dilation  . GREEN LIGHT LASER TURP (TRANSURETHRAL RESECTION OF PROSTATE    . PROSTATE BIOPSY    . RADIOACTIVE SEED IMPLANT N/A 08/28/2019   Procedure: RADIOACTIVE SEED IMPLANT/BRACHYTHERAPY IMPLANT;  Surgeon: Irine Seal, MD;  Location: [facility];  Service: Urology;  Laterality: N/A;  . SPACE OAR INSTILLATION N/A 08/28/2019   Procedure: SPACE OAR INSTILLATION;  Surgeon: Irine Seal, MD;  Location: [facility];  Service: Urology;   Laterality: N/A;  . TONSILLECTOMY     Family History  Problem Relation Age of Onset  . Bone cancer Mother   . Bladder Cancer Sister   . Rectal cancer Brother   . Bladder Cancer Brother   . Diabetes Neg Hx   . Heart disease Neg Hx   . Hyperlipidemia Neg Hx   . Cancer Neg Hx   . Breast cancer Neg Hx   . Pancreatic cancer Neg Hx    Social History   Socioeconomic History  . Marital status: Widowed    Spouse name: Not on file  . Number of children: 2  . Years of education: Not on file  . Highest education level: Not on file  Occupational History  . Occupation: Furniture conservator/restorer    Comment: retired  Tobacco Use  . Smoking status: Former Smoker    Packs/day: 1.00    Years: 60.00    Pack years: 60.00    Types: Cigarettes    Start date: 07/25/1959    Quit date: 10/07/2015    Years since quitting: 5.0  . Smokeless tobacco: Never Used  Vaping Use  . Vaping Use: Never used  Substance and Sexual Activity  . Alcohol use: Yes    Comment: occassionally  . Drug use: Not Currently  . Sexual activity: Not Currently  Other Topics Concern  . Not on file  Social History Narrative  . Not on file   Social Determinants of Health  Financial Resource Strain: Not on file  Food Insecurity: Not on file  Transportation Needs: Not on file  Physical Activity: Not on file  Stress: Not on file  Social Connections: Not on file    Tobacco Counseling Counseling given: Not Answered   Clinical Intake:                 Diabetic?No          Activities of Daily Living No flowsheet data found.  Patient Care Team: Martinique, Betty G, MD as PCP - General (Family Medicine) Irine Seal, MD as Consulting Physician (Urology) Tyler Pita, MD as Consulting Physician (Radiation Oncology) Cira Rue, RN Nurse Navigator as Registered Nurse (Medical Oncology)  Indicate any recent Medical Services you may have received from other than [facility] providers in the past year (date may be  approximate).     Assessment:   This is a routine wellness examination for Terry Morales.  Hearing/Vision screen No exam data present  Dietary issues and exercise activities discussed:    Goals   None    Depression Screen PHQ 2/9 Scores 11/10/2019  PHQ - 2 Score 0    Fall Risk Fall Risk  11/10/2019  Falls in the past year? 0  Number falls in past yr: 0  Injury with Fall? 0  Follow up Education provided    [place]:  Any stairs in or around the home? {YES/NO:21197} If so, are there any without handrails? No  Home free of loose throw rugs in walkways, pet beds, electrical cords, etc? Yes  Adequate lighting in your home to reduce risk of falls? Yes   ASSISTIVE DEVICES UTILIZED TO PREVENT FALLS:  Life alert? {YES/NO:21197} Use of a cane, walker or w/c? {YES/NO:21197} Grab bars in the bathroom? {YES/NO:21197} Shower chair or bench in shower? {YES/NO:21197} Elevated toilet seat or a handicapped toilet? {YES/NO:21197}    Cognitive Function:        Immunizations  There is no immunization history on file for this patient.  TDAP status: Due, Education has been provided regarding the importance of this vaccine. Advised may receive this vaccine at local pharmacy or Health Dept. Aware to provide a copy of the vaccination record if obtained from local pharmacy or Health Dept. Verbalized acceptance and understanding.  Flu Vaccine status: Due, Education has been provided regarding the importance of this vaccine. Advised may receive this vaccine at local pharmacy or Health Dept. Aware to provide a copy of the vaccination record if obtained from local pharmacy or Health Dept. Verbalized acceptance and understanding.  Pneumococcal vaccine status: Due, Education has been provided regarding the importance of this vaccine. Advised may receive this vaccine at local pharmacy or Health Dept. Aware to provide a copy of the vaccination record if obtained from  local pharmacy or Health Dept. Verbalized acceptance and understanding.  Covid-19 vaccine status: Declined, Education has been provided regarding the importance of this vaccine but patient still declined. Advised may receive this vaccine at local pharmacy or Health Dept.or vaccine clinic. Aware to provide a copy of the vaccination record if obtained from local pharmacy or Health Dept. Verbalized acceptance and understanding.  Qualifies for Shingles Vaccine? Yes   Zostavax completed No   Shingrix Completed?: No.    Education has been provided regarding the importance of this vaccine. Patient has been advised to call insurance company to determine out of pocket expense if they have not yet received this vaccine. Advised may also receive vaccine at local  pharmacy or Health Dept. Verbalized acceptance and understanding.  Screening Tests Health Maintenance  Topic Date Due  . Hepatitis C Screening  Never done  . COVID-19 Vaccine (1) Never done  . COLONOSCOPY (Pts 45-28yrs Insurance coverage will need to be confirmed)  Never done  . INFLUENZA VACCINE  Never done  . PNA vac Low Risk Adult (1 of 2 - PCV13) 11/09/2020 (Originally 10/30/2011)  . HPV VACCINES  Aged Out  . TETANUS/TDAP  Discontinued    Health Maintenance  Health Maintenance Due  Topic Date Due  . Hepatitis C Screening  Never done  . COVID-19 Vaccine (1) Never done  . COLONOSCOPY (Pts 45-67yrs Insurance coverage will need to be confirmed)  Never done  . INFLUENZA VACCINE  Never done    {Colorectal cancer screening:2101809}  Lung Cancer Screening: (Low Dose CT Chest recommended if Age 14-80 years, 30 pack-year currently smoking OR have quit w/in 15years.) does qualify.   Lung Cancer Screening Referral: ***  Additional Screening:  Hepatitis C Screening: does qualify;   Vision Screening: Recommended annual ophthalmology exams for early detection of glaucoma and other disorders of the eye. Is the patient up to date with their  annual eye exam?  {YES/NO:21197} Who is the provider or what is the name of the office in which the patient attends annual eye exams? *** If pt is not established with a provider, would they like to be referred to a provider to establish care? {YES/NO:21197}.   Dental Screening: Recommended annual dental exams for proper oral hygiene  Community Resource Referral / Chronic Care Management: CRR required this visit?  No   CCM required this visit?  No      Plan:     I have personally reviewed and noted the following in the patient's chart:   . Medical and social history . Use of alcohol, tobacco or illicit drugs  . Current medications and supplements . Functional ability and status . Nutritional status . Physical activity . Advanced directives . List of other physicians . Hospitalizations, surgeries, and ER visits in previous 12 months . Vitals . Screenings to include cognitive, depression, and falls . Referrals and appointments  In addition, I have reviewed and discussed with patient certain preventive protocols, quality metrics, and best practice recommendations. A written personalized care plan for preventive services as well as general preventive health recommendations were provided to patient.     Ofilia Neas, LPN   1/50/5697   Nurse Notes: None

## 2020-10-12 ENCOUNTER — Other Ambulatory Visit: Payer: Self-pay

## 2020-10-12 ENCOUNTER — Ambulatory Visit: Payer: Medicare Other

## 2020-10-12 DIAGNOSIS — Z0000 Encounter for general adult medical examination without abnormal findings: Secondary | ICD-10-CM

## 2020-10-12 NOTE — Progress Notes (Signed)
Erroneous Encounter

## 2020-11-08 ENCOUNTER — Other Ambulatory Visit: Payer: Self-pay

## 2020-11-09 ENCOUNTER — Encounter: Payer: Medicare Other | Admitting: Family Medicine

## 2020-11-09 ENCOUNTER — Telehealth: Payer: Self-pay | Admitting: Family Medicine

## 2020-11-09 DIAGNOSIS — I10 Essential (primary) hypertension: Secondary | ICD-10-CM

## 2020-11-09 MED ORDER — AMLODIPINE BESYLATE 5 MG PO TABS: 5.0000 mg | ORAL_TABLET | Freq: Every day | ORAL | 3 refills | Status: DC

## 2020-11-09 NOTE — Telephone Encounter (Signed)
Rx sent in, readings given to pcp.

## 2020-11-09 NOTE — Telephone Encounter (Signed)
Patient had an appointment today but was late. He states that he needs a refill on his blood pressure medication. He says that he ran out this morning.  Patient also gave [place] a list of his readings (given to Judson Roch).   Please advise.

## 2020-11-25 ENCOUNTER — Encounter: Payer: Self-pay | Admitting: Family Medicine

## 2020-11-25 ENCOUNTER — Telehealth: Payer: Self-pay | Admitting: Family Medicine

## 2020-11-25 ENCOUNTER — Other Ambulatory Visit: Payer: Self-pay

## 2020-11-25 ENCOUNTER — Ambulatory Visit (INDEPENDENT_AMBULATORY_CARE_PROVIDER_SITE_OTHER): Payer: Medicare Other | Admitting: Family Medicine

## 2020-11-25 VITALS — BP 152/72 | HR 71 | Temp 98.6°F | Resp 16 | Ht 72.0 in | Wt 196.8 lb

## 2020-11-25 DIAGNOSIS — R739 Hyperglycemia, unspecified: Secondary | ICD-10-CM

## 2020-11-25 DIAGNOSIS — M25571 Pain in right ankle and joints of right foot: Secondary | ICD-10-CM

## 2020-11-25 DIAGNOSIS — R0989 Other specified symptoms and signs involving the circulatory and respiratory systems: Secondary | ICD-10-CM

## 2020-11-25 DIAGNOSIS — Z1159 Encounter for screening for other viral diseases: Secondary | ICD-10-CM | POA: Diagnosis not present

## 2020-11-25 DIAGNOSIS — I10 Essential (primary) hypertension: Secondary | ICD-10-CM

## 2020-11-25 DIAGNOSIS — Z0000 Encounter for general adult medical examination without abnormal findings: Secondary | ICD-10-CM

## 2020-11-25 DIAGNOSIS — H6192 Disorder of left external ear, unspecified: Secondary | ICD-10-CM

## 2020-11-25 DIAGNOSIS — G8929 Other chronic pain: Secondary | ICD-10-CM

## 2020-11-25 DIAGNOSIS — M25572 Pain in left ankle and joints of left foot: Secondary | ICD-10-CM

## 2020-11-25 DIAGNOSIS — I739 Peripheral vascular disease, unspecified: Secondary | ICD-10-CM

## 2020-11-25 LAB — COMPREHENSIVE METABOLIC PANEL
ALT: 11 U/L (ref 0–53)
AST: 10 U/L (ref 0–37)
Albumin: 4.4 g/dL (ref 3.5–5.2)
Alkaline Phosphatase: 74 U/L (ref 39–117)
BUN: 15 mg/dL (ref 6–23)
CO2: 28 mEq/L (ref 19–32)
Calcium: 9.9 mg/dL (ref 8.4–10.5)
Chloride: 101 mEq/L (ref 96–112)
Creatinine, Ser: 1.28 mg/dL (ref 0.40–1.50)
GFR: 55.3 mL/min — ABNORMAL LOW (ref >60.00)
Glucose, Bld: 121 mg/dL — ABNORMAL HIGH (ref 70–99)
Potassium: 4.7 mEq/L (ref 3.5–5.1)
Sodium: 136 mEq/L (ref 135–145)
Total Bilirubin: 0.8 mg/dL (ref 0.2–1.2)
Total Protein: 6.8 g/dL (ref 6.0–8.3)

## 2020-11-25 LAB — HEMOGLOBIN A1C: Hgb A1c MFr Bld: 5.7 % (ref 4.6–6.5)

## 2020-11-25 NOTE — Progress Notes (Signed)
HPI:  [name] is a 75 y.o.male here today for his routine physical examination.  Last CPE: A few years ago. He lives alone. His daughter lives close by.  Regular exercise 3 or more times per week: He is walking daily for 45-60 min. Following a healthy diet: In general he does, states that he eats "what I want."  Chronic medical problems: HTN,prostate cancer,PAC's,and high alcohol consumption among some.   There is no immunization history on file for this patient.  -Hep C screening: Never.  Last colon cancer screening: Over 10 years ago. He does not want further screening. He follows with urologist regularly. Glucose has been mildly elevated in the past, 111-136,no hx of the diabetes.  Former smoker. He drinks alcohol daily, beer and liquor x 2 shoots daily.   -Concerns and/or follow up today:  HTN: Dx's 11+ years ago. He is on Amlodipine 5 mg daily. BP at home has been mildly elevated, 140;s-160's/60-70's. Negative for severe/frequent headache, visual changes, chest pain, dyspnea, or neurologic focal deficit.   Bilateral ankle pain, constant,and severe. Problem has been going on for years. Hx of ankles fractures,surgically treated. Exacerbated by prolonged standing and walking. Negative for joint edema or erythema. He is not taking OTC medications.  Skin lesion above right ear. He has had lesion for years, slow growth. No tenderness or pruritus. Negative for easy bleeding. He traumatizes lesion frequent with eye glasses and mask.He would like lesion removed.  Review of Systems  Constitutional: Negative for activity change, appetite change, fatigue and fever.  HENT: Negative for mouth sores, nosebleeds and sore throat.   Eyes: Negative for photophobia and redness.  Respiratory: Negative for cough, shortness of breath and wheezing.   Cardiovascular: Negative for chest pain, palpitations and leg swelling.  Gastrointestinal: Negative for abdominal pain,  blood in stool, nausea and vomiting.  Endocrine: Negative for cold intolerance, heat intolerance, polydipsia, polyphagia and polyuria.  Genitourinary: Negative for decreased urine volume, dysuria, genital sores, hematuria and testicular pain.  Musculoskeletal: Positive for arthralgias. Negative for myalgias.  Skin: Negative for color change and rash.  Allergic/Immunologic: Positive for environmental allergies.  Neurological: Negative for syncope and facial asymmetry.  Hematological: Negative for adenopathy. Does not bruise/bleed easily.  Psychiatric/Behavioral: Negative for confusion and hallucinations.   Current Outpatient Medications on File Prior to Visit  Medication Sig Dispense Refill  . amLODipine (NORVASC) 5 MG tablet Take 1 tablet (5 mg total) by mouth daily. 90 tablet 3  . loratadine (CLARITIN) 10 MG tablet Take 10 mg by mouth daily.    . tamsulosin (FLOMAX) 0.4 MG CAPS capsule Take 0.4 mg by mouth daily.     No current facility-administered medications on file prior to visit.   Past Medical History:  Diagnosis Date  . Hypertension   . Prostate cancer [facility])    Past Surgical History:  Procedure Laterality Date  . ANKLE SURGERY     bilateral  . CYSTOSCOPY     with dilation  . GREEN LIGHT LASER TURP (TRANSURETHRAL RESECTION OF PROSTATE    . PROSTATE BIOPSY    . RADIOACTIVE SEED IMPLANT N/A 08/28/2019   Procedure: RADIOACTIVE SEED IMPLANT/BRACHYTHERAPY IMPLANT;  Surgeon: Irine Seal, MD;  Location: [facility];  Service: Urology;  Laterality: N/A;  . SPACE OAR INSTILLATION N/A 08/28/2019   Procedure: SPACE OAR INSTILLATION;  Surgeon: Irine Seal, MD;  [facility]  Service: Urology;  Laterality: N/A;  . TONSILLECTOMY      No Known  Allergies  Family History  Problem Relation Age of Onset  . Bone cancer Mother   . Bladder Cancer Sister   . Rectal cancer Brother   . Bladder Cancer Brother   . Diabetes Neg Hx   . Heart disease Neg Hx    . Hyperlipidemia Neg Hx   . Cancer Neg Hx   . Breast cancer Neg Hx   . Pancreatic cancer Neg Hx     Social History   Socioeconomic History  . Marital status: Widowed    Spouse name: Not on file  . Number of children: 2  . Years of education: Not on file  . Highest education level: Not on file  Occupational History  . Occupation: Furniture conservator/restorer    Comment: retired  Tobacco Use  . Smoking status: Former Smoker    Packs/day: 1.00    Years: 60.00    Pack years: 60.00    Types: Cigarettes    Start date: 07/25/1959    Quit date: 10/07/2015    Years since quitting: 5.1  . Smokeless tobacco: Never Used  Vaping Use  . Vaping Use: Never used  Substance and Sexual Activity  . Alcohol use: Yes    Comment: occassionally  . Drug use: Not Currently  . Sexual activity: Not Currently  Other Topics Concern  . Not on file  Social History Narrative  . Not on file   Social Determinants of Health   Financial Resource Strain: Not on file  Food Insecurity: Not on file  Transportation Needs: Not on file  Physical Activity: Not on file  Stress: Not on file  Social Connections: Not on file   Vitals:   11/25/20 1033  BP: (!) 152/72  Pulse: 71  Resp: 16  Temp: 98.6 F (37 C)  SpO2: 97%   Body mass index is 26.69 kg/m.  Wt Readings from Last 3 Encounters:  11/25/20 196 lb 12.8 oz (89.3 kg)  11/10/19 193 lb (87.5 kg)  08/28/19 184 lb 14.4 oz (83.9 kg)   Physical Exam Vitals and nursing note reviewed.  Constitutional:      General: He is not in acute distress.    Appearance: He is well-developed.  HENT:     Head: Normocephalic and atraumatic.     Comments: Hearing aids.    Right Ear: Tympanic membrane, ear canal and external ear normal.     Left Ear: Tympanic membrane, ear canal and external ear normal.     Ears:      Mouth/Throat:     Mouth: Mucous membranes are moist.     Pharynx: Oropharynx is clear.  Eyes:     Conjunctiva/sclera: Conjunctivae normal.     Pupils:  Pupils are equal, round, and reactive to light.  Neck:     Thyroid: No thyroid mass.     Trachea: No tracheal deviation.  Cardiovascular:     Rate and Rhythm: Normal rate and regular rhythm.     Heart sounds: No murmur heard.     Comments: Difficulty finding peripheral pulses, bilateral. Pulmonary:     Effort: Pulmonary effort is normal. No respiratory distress.     Breath sounds: Normal breath sounds.  Abdominal:     Palpations: Abdomen is soft. There is no hepatomegaly or mass.     Tenderness: There is no abdominal tenderness.  Genitourinary:    Comments: Deferred to urologist. Musculoskeletal:        General: No tenderness.     Cervical back: Normal range of motion.  Right lower leg: 1+ Pitting Edema present.     Left lower leg: 1+ Pitting Edema present.     Comments: No signs of synovitis.  Lymphadenopathy:     Cervical: No cervical adenopathy.  Skin:    General: Skin is warm.     Findings: No erythema.  Neurological:     General: No focal deficit present.     Mental Status: He is alert and oriented to person, place, and time.     Cranial Nerves: No cranial nerve deficit.     Sensory: No sensory deficit.     Coordination: Coordination normal.     Gait: Gait normal.     Deep Tendon Reflexes:     Reflex Scores:      Bicep reflexes are 2+ on the right side and 2+ on the left side.      Patellar reflexes are 2+ on the right side and 2+ on the left side.  ASSESSMENT AND PLAN:  [name] was seen today for annual exam.  Diagnoses and all orders for this visit: Orders Placed This Encounter  Procedures  . Comprehensive metabolic panel  . Hemoglobin A1c  . Hepatitis C antibody screen  . Ambulatory referral to Dermatology  . VAS [place] ABI WITH/WO TBI   Lab Results  Component Value Date   HGBA1C 5.7 11/25/2020   Lab Results  Component Value Date   CREATININE 1.28 11/25/2020   BUN 15 11/25/2020   NA 136 11/25/2020   K 4.7 11/25/2020   CL 101 11/25/2020   CO2 28  11/25/2020   Lab Results  Component Value Date   ALT 11 11/25/2020   AST 10 11/25/2020   ALKPHOS 74 11/25/2020   BILITOT 0.8 11/25/2020   Routine general medical examination at a health care facility We discussed the importance of regular low impact physical activity and healthy diet for prevention of chronic illness and/or complications. Preventive guidelines reviewed. Vaccination: Refused. He is not interested in further colon cancer screening.  Next CPE in a year.  Hypertension, essential, benign BP is not adequately controlled. We discussed possible complications of elevated BP. Options discussed, he agrees with increasing dose of Amlodipine from 5 mg to 7.5 mg daily. Continue monitoring BP,if still > 140/90 in 3-4 weeks ,he can increased Amlodipine dose to 10 mg. Low salt diet.  Hyperglycemia Healthy life style for diabetes prevention recommended.  Skin lesion of left ear SK like lesion above ear, reassured. Causing irritation with eye glasses, so derma referral placed to have it removed.  Encounter for HCV screening test for low risk patient -     Hepatitis C antibody screen  Abnormal peripheral pulse ? PAD. ABI will be arranged.  Chronic pain of both ankles Tylenol 500 mg 3-4 times per day is appropriate for pain management. I do not think imaging is needed at this time. Topical icy hot/asper cream may also help.  Return in 4 months (on 03/28/2021) for HTN. Needs AWV.   Stefen Juba G. Martinique, MD  [facility]. [place] office.  A few things to remember from today's visit:   Routine general medical examination at a health care facility  Hypertension, essential, benign - Plan: Comprehensive metabolic panel  Hyperglycemia - Plan: Hemoglobin A1c  Skin lesion of left ear - Plan: Ambulatory referral to Dermatology  Encounter for HCV screening test for low risk patient - Plan: Hepatitis C antibody screen  Abnormal peripheral pulse - Plan: VAS [place] ABI  WITH/WO TBI  Chronic pain of  both ankles  If you need refills please call your pharmacy. Do not use My Chart to request refills or for acute issues that need immediate attention.   Increased Amlodipine from 5 mg to 7.5 mg (1.5 tab). If blood pressure still elevated in 3-4 weeks you can take 2 tabs (10 mg). Blood pressure ideally less 140/90. Caution with Aleve. Tylenol 500 mg 3-4 tabs daily is appropriate.  Please be sure medication list is accurate. If a new problem present, please set up appointment sooner than planned today.  A few tips:  -As we age balance is not as good as it was, so there is a higher risks for falls. Please remove small rugs and furniture that is "in your way" and could increase the risk of falls. Stretching exercises may help with fall prevention: Yoga and Tai Chi are some examples. Low impact exercise is better, so you are not very achy the next day.  -Sun screen and avoidance of direct sun light recommended. Caution with dehydration, if working outdoors be sure to drink enough fluids.  - Some medications are not safe as we age, increases the risk of side effects and can potentially interact with other medication you are also taken;  including some of over the counter medications. Be sure to let me know when you start a new medication even if it is a dietary/vitamin supplement.   -Healthy diet low in red meet/animal fat and sugar + regular physical activity is recommended.

## 2020-11-25 NOTE — Telephone Encounter (Signed)
Patient's daughter Terry Morales came in today after father's appointment, and stated that her father was here earlier today (05/05) for an appointment. She is wanting to speak with someone about his appointment today because her father does not remember what they told him as far as future appointments, next steps etc.  Terry Morales would like a call at [phone].  Please advise.

## 2020-11-25 NOTE — Patient Instructions (Addendum)
A few things to remember from today's visit:   Routine general medical examination at a health care facility  Hypertension, essential, benign - Plan: Comprehensive metabolic panel  Hyperglycemia - Plan: Hemoglobin A1c  Skin lesion of left ear - Plan: Ambulatory referral to Dermatology  Encounter for HCV screening test for low risk patient - Plan: Hepatitis C antibody screen  Abnormal peripheral pulse - Plan: VAS [place] ABI WITH/WO TBI  Chronic pain of both ankles  If you need refills please call your pharmacy. Do not use My Chart to request refills or for acute issues that need immediate attention.   Increased Amlodipine from 5 mg to 7.5 mg (1.5 tab). If blood pressure still elevated in 3-4 weeks you can take 2 tabs (10 mg). Blood pressure ideally less 140/90. Caution with Aleve. Tylenol 500 mg 3-4 tabs daily is appropriate.  Please be sure medication list is accurate. If a new problem present, please set up appointment sooner than planned today.  A few tips:  -As we age balance is not as good as it was, so there is a higher risks for falls. Please remove small rugs and furniture that is "in your way" and could increase the risk of falls. Stretching exercises may help with fall prevention: Yoga and Tai Chi are some examples. Low impact exercise is better, so you are not very achy the next day.  -Sun screen and avoidance of direct sun light recommended. Caution with dehydration, if working outdoors be sure to drink enough fluids.  - Some medications are not safe as we age, increases the risk of side effects and can potentially interact with other medication you are also taken;  including some of over the counter medications. Be sure to let me know when you start a new medication even if it is a dietary/vitamin supplement.   -Healthy diet low in red meet/animal fat and sugar + regular physical activity is recommended.

## 2020-11-26 ENCOUNTER — Ambulatory Visit (HOSPITAL_COMMUNITY)
Admission: RE | Admit: 2020-11-26 | Discharge: 2020-11-26 | Disposition: A | Discharge status: Home / Self Care | Payer: Medicare Other | Source: Ambulatory Visit | Attending: Family Medicine | Admitting: Family Medicine

## 2020-11-26 DIAGNOSIS — I739 Peripheral vascular disease, unspecified: Secondary | ICD-10-CM | POA: Insufficient documentation

## 2020-11-26 DIAGNOSIS — R0989 Other specified symptoms and signs involving the circulatory and respiratory systems: Secondary | ICD-10-CM | POA: Diagnosis not present

## 2020-11-26 LAB — HEPATITIS C ANTIBODY
Hepatitis C Ab: NONREACTIVE
SIGNAL TO CUT-OFF: 0 (ref <1.00)

## 2020-11-29 NOTE — Telephone Encounter (Signed)
I called and spoke with Terry Morales, we went over her questions. Nothing further needed.

## 2020-12-03 MED ORDER — AMLODIPINE BESYLATE 5 MG PO TABS: 7.5000 mg | ORAL_TABLET | Freq: Every day | ORAL | 3 refills | Status: DC

## 2020-12-06 ENCOUNTER — Other Ambulatory Visit: Payer: Self-pay

## 2020-12-06 MED ORDER — ATORVASTATIN CALCIUM 20 MG PO TABS: 20.0000 mg | ORAL_TABLET | Freq: Every day | ORAL | 3 refills | Status: DC

## 2020-12-06 MED ORDER — ASPIRIN 81 MG PO TBEC: 81.0000 mg | DELAYED_RELEASE_TABLET | Freq: Every day | ORAL | 12 refills | Status: AC

## 2021-01-04 ENCOUNTER — Telehealth: Payer: Self-pay | Admitting: Family Medicine

## 2021-01-04 DIAGNOSIS — I10 Essential (primary) hypertension: Secondary | ICD-10-CM

## 2021-01-04 MED ORDER — AMLODIPINE BESYLATE 5 MG PO TABS: 7.5000 mg | ORAL_TABLET | Freq: Every day | ORAL | 2 refills | Status: DC

## 2021-01-04 NOTE — Telephone Encounter (Signed)
Rx corrected and resent.  

## 2021-01-04 NOTE — Telephone Encounter (Signed)
Patient's daughter Terry Morales called in to ask if a prescription for his blood pressure medication can be sent in to [facility] on [address]. She states that his dosage was adjusted during his last visit (he is taking one and half pill, she says) and when we he went to get a refill, he had to pay out of pocket. Terry Morales was told to call [facility] in order to get the dosage fixed.  Please advise.

## 2021-01-05 ENCOUNTER — Other Ambulatory Visit: Payer: Self-pay

## 2021-01-05 ENCOUNTER — Ambulatory Visit (INDEPENDENT_AMBULATORY_CARE_PROVIDER_SITE_OTHER): Payer: Medicare Other

## 2021-01-05 DIAGNOSIS — Z0000 Encounter for general adult medical examination without abnormal findings: Secondary | ICD-10-CM

## 2021-01-05 NOTE — Progress Notes (Addendum)
Virtual Visit via Telephone Note  I connected with  Terry Morales on 01/05/21 at 11:00 AM EDT by telephone and verified that I am speaking with the correct person using two identifiers.  Medicare Annual Wellness visit completed telephonically due to Covid-19 pandemic.   Persons participating in this call: This Health Coach and this patient.   Location: Patient: Home Provider: Office   I discussed the limitations, risks, security and privacy concerns of performing an evaluation and management service by telephone and the availability of in person appointments. The patient expressed understanding and agreed to proceed.  Unable to perform video visit due to video visit attempted and failed and/or patient does not have video capability.   Some vital signs may be absent or patient reported.   Willette Brace, LPN   Subjective:   Terry Morales is a 75 y.o. male who presents for an Initial Medicare Annual Wellness Visit.  Review of Systems     Cardiac Risk Factors include: advanced age (>60men, >83 women);hypertension;male gender     Objective:    There were no vitals filed for this visit. There is no height or weight on file to calculate BMI.  Advanced Directives 01/05/2021 09/25/2019 08/28/2019 06/17/2019 12/11/2018  Does Patient Have a Medical Advance Directive? Yes No Yes Yes Yes  Type of Advance Directive [place];Living will Living will;Healthcare Power of Attorney  Does patient want to make changes to medical advance directive? - No - Patient declined No - Guardian declined No - Patient declined -  Copy of [place] in Chart? No - copy requested - - No - copy requested No - copy requested  Would patient like information on creating a medical advance directive? - No - Guardian declined - - -    Current Medications (verified) Outpatient Encounter Medications as of 01/05/2021  Medication Sig   amLODipine  (NORVASC) 5 MG tablet Take 1.5 tablets (7.5 mg total) by mouth daily.   aspirin 81 MG EC tablet Take 1 tablet (81 mg total) by mouth daily. Swallow whole.   atorvastatin (LIPITOR) 20 MG tablet Take 1 tablet (20 mg total) by mouth daily.   loratadine (CLARITIN) 10 MG tablet Take 10 mg by mouth daily.   [DISCONTINUED] tamsulosin (FLOMAX) 0.4 MG CAPS capsule Take 0.4 mg by mouth daily. (Patient not taking: Reported on 01/05/2021)   No facility-administered encounter medications on file as of 01/05/2021.    Allergies (verified) Patient has no known allergies.   History: Past Medical History:  Diagnosis Date   Hypertension    Prostate cancer ([place])    Past Surgical History:  Procedure Laterality Date   ANKLE SURGERY     bilateral   CYSTOSCOPY     with dilation   GREEN LIGHT LASER TURP (TRANSURETHRAL RESECTION OF PROSTATE     PROSTATE BIOPSY     RADIOACTIVE SEED IMPLANT N/A 08/28/2019   Procedure: RADIOACTIVE SEED IMPLANT/BRACHYTHERAPY IMPLANT;  Surgeon: Irine Seal, MD;  Location: [facility];  Service: Urology;  Laterality: N/A;   SPACE OAR INSTILLATION N/A 08/28/2019   Procedure: SPACE OAR INSTILLATION;  Surgeon: Irine Seal, MD;  Location: [facility];  Service: Urology;  Laterality: N/A;   TONSILLECTOMY     Family History  Problem Relation Age of Onset   Bone cancer Mother    Bladder Cancer Sister    Rectal cancer Brother    Bladder Cancer Brother    Diabetes Neg  Hx    Heart disease Neg Hx    Hyperlipidemia Neg Hx    Cancer Neg Hx    Breast cancer Neg Hx    Pancreatic cancer Neg Hx    Social History   Socioeconomic History   Marital status: Widowed    Spouse name: Not on file   Number of children: 2   Years of education: Not on file   Highest education level: Not on file  Occupational History   Occupation: Furniture conservator/restorer    Comment: retired  Tobacco Use   Smoking status: Former    Packs/day: 1.00    Years: 60.00    Pack years: 60.00     Types: Cigarettes    Start date: 07/25/1959    Quit date: 10/07/2015    Years since quitting: 5.2   Smokeless tobacco: Never  Vaping Use   Vaping Use: Never used  Substance and Sexual Activity   Alcohol use: Yes    Comment: occassionally   Drug use: Not Currently   Sexual activity: Not Currently  Other Topics Concern   Not on file  Social History Narrative   Not on file   Social Determinants of Health   Financial Resource Strain: Low Risk    Difficulty of Paying Living Expenses: Not hard at all  Food Insecurity: No Food Insecurity   Worried About Charity fundraiser in the Last Year: Never true   [place] in the Last Year: Never true  Transportation Needs: No Transportation Needs   Lack of Transportation (Medical): No   Lack of Transportation (Non-Medical): No  Physical Activity: Sufficiently Active   Days of Exercise per Week: 5 days   Minutes of Exercise per Session: 60 min  Stress: No Stress Concern Present   Feeling of Stress : Not at all  Social Connections: Socially Isolated   Frequency of Communication with Friends and Family: More than three times a week   Frequency of Social Gatherings with Friends and Family: More than three times a week   Attends Religious Services: Never   Marine scientist or Organizations: No   Attends Archivist Meetings: Never   Marital Status: Widowed    Tobacco Counseling Counseling given: Not Answered   Clinical Intake:  Pre-visit preparation completed: Yes  Pain : No/denies pain     BMI - recorded: 26.69 Nutritional Status: BMI 25 -29 Overweight Nutritional Risks: None Diabetes: No  How often do you need to have someone help you when you read instructions, pamphlets, or other written materials from your doctor or pharmacy?: 1 - Never  Diabetic?No  Interpreter Needed?: No  Information entered by :: Charlott Rakes, LPN   Activities of Daily Living In your present state of health, do you have any  difficulty performing the following activities: 01/05/2021  Hearing? Y  Comment HOH  Vision? N  Difficulty concentrating or making decisions? N  Walking or climbing stairs? N  Dressing or bathing? N  Doing errands, shopping? N  Preparing Food and eating ? N  Using the Toilet? N  In the past six months, have you accidently leaked urine? N  Do you have problems with loss of bowel control? N  Managing your Medications? N  Managing your Finances? N  Housekeeping or managing your Housekeeping? N  Some recent data might be hidden    Patient Care Team: [place], Betty G, MD as PCP - General (Family Medicine) Irine Seal, MD as Consulting Physician (Urology) Tyler Pita,  MD as Consulting Physician (Radiation Oncology) Cira Rue, RN Nurse Navigator as Registered Nurse (Medical Oncology)  Indicate any recent [place] you may have received from other than [facility] providers in the past year (date may be approximate).     Assessment:   This is a routine wellness examination for Terry Morales.  Hearing/Vision screen Hearing Screening - Comments:: Pt wears hearing aids  Vision Screening - Comments:: Pt [facility] with [facility] for annual eye exams   Dietary issues and exercise activities discussed: Current Exercise Habits: Home exercise routine, Type of exercise: walking, Time (Minutes): > 60, Frequency (Times/Week): 5, Weekly Exercise (Minutes/Week): 0   Goals Addressed             This Visit's Progress    Patient Stated       None at this time        Depression Screen PHQ 2/9 Scores 01/05/2021 11/10/2019  PHQ - 2 Score 1 0    Fall Risk Fall Risk  01/05/2021 11/10/2019  Falls in the past year? 0 0  Number falls in past yr: 0 0  Injury with Fall? 0 0  Risk for fall due to : Impaired vision -  Follow up Falls prevention discussed Education provided    FALL RISK PREVENTION PERTAINING TO THE HOME:  Any stairs in or around the home? No  If so, are there any without  handrails? No  Home free of loose throw rugs in walkways, pet beds, electrical cords, etc? Yes Adequate lighting in your home to reduce risk of falls? Yes  ASSISTIVE DEVICES UTILIZED TO PREVENT FALLS:  Life alert? No  Use of a cane, walker or w/c? No  Grab bars in the bathroom? Yes  Shower chair or bench in shower? Yes  Elevated toilet seat or a handicapped toilet? Yes   TIMED UP AND GO:  Was the test performed? No .     Cognitive Function:        Immunizations  There is no immunization history on file for this patient.    Flu Vaccine status: Declined, Education has been provided regarding the importance of this vaccine but patient still declined. Advised may receive this vaccine at local pharmacy or Health Dept. Aware to provide a copy of the vaccination record if obtained from local pharmacy or Health Dept. Verbalized acceptance and understanding.  Pneumococcal vaccine status: Declined,  Education has been provided regarding the importance of this vaccine but patient still declined. Advised may receive this vaccine at local pharmacy or Health Dept. Aware to provide a copy of the vaccination record if obtained from local pharmacy or Health Dept. Verbalized acceptance and understanding.   Covid-19 vaccine status: Completed vaccines  Qualifies for Shingles Vaccine? Yes   Zostavax completed No   Shingrix Completed?: No.    Education has been provided regarding the importance of this vaccine. Patient has been advised to call insurance company to determine out of pocket expense if they have not yet received this vaccine. Advised may also receive vaccine at local pharmacy or Health Dept. Verbalized acceptance and understanding.  Screening Tests Health Maintenance  Topic Date Due   Zoster Vaccines- Shingrix (1 of 2) Never done   COVID-19 Vaccine (1) 01/21/2021 (Originally 10/30/1950)   PNA vac Low Risk Adult (1 of 2 - PCV13) 12/03/2021 (Originally 10/30/2011)   INFLUENZA VACCINE   02/21/2021   Hepatitis C Screening  Completed   HPV VACCINES  Aged Out   COLONOSCOPY (Pts 45-55yrs Insurance coverage  will need to be confirmed)  Discontinued   TETANUS/TDAP  Discontinued    Health Maintenance  Health Maintenance Due  Topic Date Due   Zoster Vaccines- Shingrix (1 of 2) Never done    Colorectal cancer screening: No longer required. Per pt preference    Additional Screening:  Hepatitis C Screening:  Completed 11/25/20  Vision Screening: Recommended annual ophthalmology exams for early detection of glaucoma and other disorders of the eye. Is the patient up to date with their annual eye exam?  Yes  Who is the provider or what is the name of the office in which the patient attends annual eye exams? [facility] If pt is not established with a provider, would they like to be referred to a provider to establish care? No .   Dental Screening: Recommended annual dental exams for proper oral hygiene  Community Resource Referral / Chronic Care Management: CRR required this visit?  No   CCM required this visit?  No      Plan:     I have personally reviewed and noted the following in the patient's chart:   Medical and social history Use of alcohol, tobacco or illicit drugs  Current medications and supplements including opioid prescriptions. Patient is not currently taking opioid prescriptions. Functional ability and status Nutritional status Physical activity Advanced directives List of other physicians Hospitalizations, surgeries, and ER visits in previous 12 months Vitals Screenings to include cognitive, depression, and falls Referrals and appointments  In addition, I have reviewed and discussed with patient certain preventive protocols, quality metrics, and best practice recommendations. A written personalized care plan for preventive services as well as general preventive health recommendations were provided to patient.     Willette Brace, LPN   [birth year]49/2009    Nurse Notes: None

## 2021-01-05 NOTE — Patient Instructions (Signed)
Terry Morales , Thank you for taking time to come for your Medicare Wellness Visit. I appreciate your ongoing commitment to your health goals. Please review the following plan we discussed and let me know if I can assist you in the future.   Screening recommendations/referrals: Colonoscopy: Discontinued Recommended yearly ophthalmology/optometry visit for glaucoma screening and checkup Recommended yearly dental visit for hygiene and checkup  Vaccinations: Influenza vaccine: Due 02/21/21 Pneumococcal vaccine: Postponed 12/03/21 Tdap vaccine: Discontinued Shingles vaccine: Shingrix discussed. Please contact your pharmacy for coverage information.    Covid-19: Declined   Advanced directives: Please bring a copy of your health care power of attorney and living will to the office at your convenience.  Conditions/risks identified: none at this time  Next appointment: Follow up in one year for your annual wellness visit.   Preventive Care 89 Years and Older, Male Preventive care refers to lifestyle choices and visits with your health care provider that can promote health and wellness. What does preventive care include? A yearly physical exam. This is also called an annual well check. Dental exams once or twice a year. Routine eye exams. Ask your health care provider how often you should have your eyes checked. Personal lifestyle choices, including: Daily care of your teeth and gums. Regular physical activity. Eating a healthy diet. Avoiding tobacco and drug use. Limiting alcohol use. Practicing safe sex. Taking low doses of aspirin every day. Taking vitamin and mineral supplements as recommended by your health care provider. What happens during an annual well check? The services and screenings done by your health care provider during your annual well check will depend on your age, overall health, lifestyle risk factors, and family history of disease. Counseling  Your health care provider  may ask you questions about your: Alcohol use. Tobacco use. Drug use. Emotional well-being. Home and relationship well-being. Sexual activity. Eating habits. History of falls. Memory and ability to understand (cognition). Work and work Statistician. Screening  You may have the following tests or measurements: Height, weight, and BMI. Blood pressure. Lipid and cholesterol levels. These may be checked every 5 years, or more frequently if you are over 50 years old. Skin check. Lung cancer screening. You may have this screening every year starting at age 52 if you have a 30-pack-year history of smoking and currently smoke or have quit within the past 15 years. Fecal occult blood test (FOBT) of the stool. You may have this test every year starting at age 41. Flexible sigmoidoscopy or colonoscopy. You may have a sigmoidoscopy every 5 years or a colonoscopy every 10 years starting at age 56. Prostate cancer screening. Recommendations will vary depending on your family history and other risks. Hepatitis C blood test. Hepatitis B blood test. Sexually transmitted disease (STD) testing. Diabetes screening. This is done by checking your blood sugar (glucose) after you have not eaten for a while (fasting). You may have this done every 1-3 years. Abdominal aortic aneurysm (AAA) screening. You may need this if you are a current or former smoker. Osteoporosis. You may be screened starting at age 7 if you are at high risk. Talk with your health care provider about your test results, treatment options, and if necessary, the need for more tests. Vaccines  Your health care provider may recommend certain vaccines, such as: Influenza vaccine. This is recommended every year. Tetanus, diphtheria, and acellular pertussis (Tdap, Td) vaccine. You may need a Td booster every 10 years. Zoster vaccine. You may need this after age 67. Pneumococcal  13-valent conjugate (PCV13) vaccine. One dose is recommended after  age 41. Pneumococcal polysaccharide (PPSV23) vaccine. One dose is recommended after age 42. Talk to your health care provider about which screenings and vaccines you need and how often you need them. This information is not intended to replace advice given to you by your health care provider. Make sure you discuss any questions you have with your health care provider. Document Released: 08/06/2015 Document Revised: 03/29/2016 Document Reviewed: 05/11/2015 Elsevier Interactive Patient Education  2017 [place] Prevention in the Home Falls can cause injuries. They can happen to people of all ages. There are many things you can do to make your home safe and to help prevent falls. What can I do on the outside of my home? Regularly fix the edges of walkways and driveways and fix any cracks. Remove anything that might make you trip as you walk through a door, such as a raised step or threshold. Trim any bushes or trees on the path to your home. Use bright outdoor lighting. Clear any walking paths of anything that might make someone trip, such as rocks or tools. Regularly check to see if handrails are loose or broken. Make sure that both sides of any steps have handrails. Any raised decks and porches should have guardrails on the edges. Have any leaves, snow, or ice cleared regularly. Use sand or salt on walking paths during winter. Clean up any spills in your garage right away. This includes oil or grease spills. What can I do in the bathroom? Use night lights. Install grab bars by the toilet and in the tub and shower. Do not use towel bars as grab bars. Use non-skid mats or decals in the tub or shower. If you need to sit down in the shower, use a plastic, non-slip stool. Keep the floor dry. Clean up any water that spills on the floor as soon as it happens. Remove soap buildup in the tub or shower regularly. Attach bath mats securely with double-sided non-slip rug tape. Do not have  throw rugs and other things on the floor that can make you trip. What can I do in the bedroom? Use night lights. Make sure that you have a light by your bed that is easy to reach. Do not use any sheets or blankets that are too big for your bed. They should not hang down onto the floor. Have a firm chair that has side arms. You can use this for support while you get dressed. Do not have throw rugs and other things on the floor that can make you trip. What can I do in the kitchen? Clean up any spills right away. Avoid walking on wet floors. Keep items that you use a lot in easy-to-reach places. If you need to reach something above you, use a strong step stool that has a grab bar. Keep electrical cords out of the way. Do not use floor polish or wax that makes floors slippery. If you must use wax, use non-skid floor wax. Do not have throw rugs and other things on the floor that can make you trip. What can I do with my stairs? Do not leave any items on the stairs. Make sure that there are handrails on both sides of the stairs and use them. Fix handrails that are broken or loose. Make sure that handrails are as long as the stairways. Check any carpeting to make sure that it is firmly attached to the stairs. Fix any carpet  that is loose or worn. Avoid having throw rugs at the top or bottom of the stairs. If you do have throw rugs, attach them to the floor with carpet tape. Make sure that you have a light switch at the top of the stairs and the bottom of the stairs. If you do not have them, ask someone to add them for you. What else can I do to help prevent falls? Wear shoes that: Do not have high heels. Have rubber bottoms. Are comfortable and fit you well. Are closed at the toe. Do not wear sandals. If you use a stepladder: Make sure that it is fully opened. Do not climb a closed stepladder. Make sure that both sides of the stepladder are locked into place. Ask someone to hold it for you, if  possible. Clearly mark and make sure that you can see: Any grab bars or handrails. First and last steps. Where the edge of each step is. Use tools that help you move around (mobility aids) if they are needed. These include: Canes. Walkers. Scooters. Crutches. Turn on the lights when you go into a dark area. Replace any light bulbs as soon as they burn out. Set up your furniture so you have a clear path. Avoid moving your furniture around. If any of your floors are uneven, fix them. If there are any pets around you, be aware of where they are. Review your medicines with your doctor. Some medicines can make you feel dizzy. This can increase your chance of falling. Ask your doctor what other things that you can do to help prevent falls. This information is not intended to replace advice given to you by your health care provider. Make sure you discuss any questions you have with your health care provider. Document Released: 05/06/2009 Document Revised: 12/16/2015 Document Reviewed: 08/14/2014 Elsevier Interactive Patient Education  2017 [employer].

## 2021-03-05 ENCOUNTER — Other Ambulatory Visit: Payer: Self-pay | Admitting: Family Medicine

## 2021-05-23 ENCOUNTER — Encounter: Payer: Self-pay | Admitting: Dermatology

## 2021-05-23 ENCOUNTER — Other Ambulatory Visit: Payer: Self-pay

## 2021-05-23 ENCOUNTER — Ambulatory Visit: Payer: Medicare Other | Admitting: Dermatology

## 2021-05-23 DIAGNOSIS — L820 Inflamed seborrheic keratosis: Secondary | ICD-10-CM | POA: Diagnosis not present

## 2021-05-23 DIAGNOSIS — L821 Other seborrheic keratosis: Secondary | ICD-10-CM

## 2021-05-23 DIAGNOSIS — D1801 Hemangioma of skin and subcutaneous tissue: Secondary | ICD-10-CM | POA: Diagnosis not present

## 2021-05-23 DIAGNOSIS — D485 Neoplasm of uncertain behavior of skin: Secondary | ICD-10-CM

## 2021-05-23 DIAGNOSIS — Z1283 Encounter for screening for malignant neoplasm of skin: Secondary | ICD-10-CM

## 2021-05-23 NOTE — Patient Instructions (Signed)

## 2021-06-07 ENCOUNTER — Encounter: Payer: Self-pay | Admitting: Dermatology

## 2021-06-07 NOTE — Progress Notes (Signed)
   New Patient   Subjective  Terry Morales is a 75 y.o. male who presents for the following: New Patient (Initial Visit) (Patient here today for lesion behind his right ear x years, per patient the lesion is slowly growing, no bleeding. No personal history or family history of atypical moles, melanoma or non mole skin cancer. ).  General skin examination, growing nodule near Location:  Duration:  Quality:  Associated Signs/Symptoms: Modifying Factors:  Severity:  Timing: Context:    The following portions of the chart were reviewed this encounter and updated as appropriate:  Tobacco  Allergies  Meds  Problems  Med Hx  Surg Hx  Fam Hx      Objective  Well appearing patient in no apparent distress; mood and affect are within normal limits. Waist up skin examination, no atypical pigmented lesions.  Crust on ear with history of growth will be biopsied.  Mid Back, Right Parietal Scalp Multiple seborrheic keratosis especially on torso, 4 to 12 mm brown textured flattopped papules  Right Parietal Scalp Black focally hemorrhagic 1.2 cm exophytic nodule, dermoscopy supports irritated keratosis but will obtain biopsy     Mid Back Multiple 2 mm smooth red dermal papules    All skin waist up examined.   Assessment & Plan  Seborrheic keratosis (2) Mid Back; Right Parietal Scalp  Leave if stable  Neoplasm of uncertain behavior of skin Right Parietal Scalp  Skin / nail biopsy Type of biopsy: tangential   Informed consent: discussed and consent obtained   Timeout: patient name, date of birth, surgical site, and procedure verified   Anesthesia: the lesion was anesthetized in a standard fashion   Anesthetic:  1% lidocaine w/ epinephrine 1-100,000 local infiltration Instrument used: flexible razor blade   Hemostasis achieved with: ferric subsulfate   Outcome: patient tolerated procedure well   Post-procedure details: wound care instructions given    Specimen 1 -  Surgical pathology Differential Diagnosis: SK  Check Margins: No  Hemangioma of skin Mid Back  No intervention necessary  Encounter for screening for malignant neoplasm of skin  Annual skin examination

## 2021-06-16 ENCOUNTER — Other Ambulatory Visit: Payer: Self-pay | Admitting: Family Medicine

## 2021-12-05 DIAGNOSIS — R3915 Urgency of urination: Secondary | ICD-10-CM | POA: Diagnosis not present

## 2021-12-05 DIAGNOSIS — R3912 Poor urinary stream: Secondary | ICD-10-CM | POA: Diagnosis not present

## 2021-12-05 NOTE — Progress Notes (Signed)
HPI: [name] is a 76 y.o.male here today with his daughter for his routine physical examination and follow up.  Last CPE: 11/25/20  Regular exercise: Walking a few times during the day as tolerated, chronic ankle pain. Following a healthful diet: Cooks sometimes, he eats out most of the time.  Chronic medical problems: Prostate cancer,PAC's,PAD,and HTN among some.  There is no immunization history on file for this patient.  Health Maintenance  Topic Date Due   COVID-19 Vaccine (1) 12/22/2021 (Originally 05/01/1946)   Zoster Vaccines- Shingrix (1 of 2) 07/27/2022 (Originally 10/29/1964)   INFLUENZA VACCINE  02/21/2022   Pneumonia Vaccine 86+ Years old (2 - PCV) 12/07/2022   Hepatitis C Screening  Completed   HPV VACCINES  Aged Out   COLONOSCOPY (Pts 45-6yr Insurance coverage will need to be confirmed)  Discontinued   TETANUS/TDAP  Discontinued   -Concerns and/or follow up today:  Prostate cancer, saw his urologist yesterday. Started on Silodosin 8 mg daily.  Last follow up on 11/25/20. HTN on Amlodipine 5 mg daily. Negative for CP, SOB, or focal weakness. Home BP's 130's/70's.  Lab Results  Component Value Date   CREATININE 1.28 11/25/2020   BUN 15 11/25/2020   NA 136 11/25/2020   K 4.7 11/25/2020   CL 101 11/25/2020   CO2 28 11/25/2020   Lab Results  Component Value Date   HGBA1C 5.7 11/25/2020   PAD: He is not taking Atorvastatin, ran out of medication. He is on Aspirin 81 mg daily. ABI in 11/2020: Right: Resting right ankle-brachial index indicates mild right lower extremity arterial disease. The right toe-brachial index is normal.  Left: Resting left ankle-brachial index indicates moderate left lower extremity arterial disease. The left toe-brachial index is abnormal.   Having left distal extremity pain when walking for more than 14 min,behind knee. It is alleviated by resting for 5-10 min. Problm has been going on for 6 months. Occasional tingling  sensation. He has not noted edema,erythema,cyanosis or ulcers. Bilateral ankle pain, he takes Tylenol.  Former smoker. Drinks alcohol daily, 4 oz "or more" of beer,liquor,or wine.  Umbilical hernia: Noted about 4 months ago. He denies any pain. Negative for abdominal pain or nausea.  Concerned about pruritic rash on back for a couple months, he is concerned about possible shingles. He has not tried OTC medications.  Review of Systems  Constitutional:  Positive for fatigue. Negative for activity change, appetite change and fever.  HENT:  Negative for nosebleeds and sore throat.   Eyes:  Negative for redness and visual disturbance.  Respiratory:  Negative for cough and wheezing.   Cardiovascular:  Negative for chest pain and palpitations.  Gastrointestinal:  Negative for abdominal pain, blood in stool and vomiting.       No changes in bowel habits.  Endocrine: Negative for cold intolerance, heat intolerance, polydipsia, polyphagia and polyuria.  Genitourinary:  Negative for decreased urine volume, dysuria and hematuria.  Musculoskeletal:  Positive for arthralgias.  Skin:  Negative for rash.  Neurological:  Negative for syncope, weakness and headaches.  Psychiatric/Behavioral:  Negative for confusion. The patient is not nervous/anxious.   All other systems reviewed and are negative.  Current Outpatient Medications on File Prior to Visit  Medication Sig Dispense Refill   acetaminophen (TYLENOL) 500 MG tablet Take 500 mg by mouth as needed.     aspirin 81 MG EC tablet Take 1 tablet (81 mg total) by mouth daily. Swallow whole. 30 tablet 12   Boswellia-Glucosamine-Vit D (  OSTEO BI-FLEX ONE PER DAY PO) Take 1 tablet by mouth daily.     loratadine-pseudoephedrine (CLARITIN-D 24-HOUR) 10-240 MG 24 hr tablet Take 1 tablet by mouth daily as needed for allergies.     Multiple Vitamins-Minerals (MENS MULTIVITAMIN) TABS Take 1 tablet by mouth daily.     silodosin (RAPAFLO) 8 MG CAPS capsule Take  8 mg by mouth daily with breakfast.     No current facility-administered medications on file prior to visit.   Past Medical History:  Diagnosis Date   Hypertension    Prostate cancer ([place])    Past Surgical History:  Procedure Laterality Date   ANKLE SURGERY     bilateral   CYSTOSCOPY     with dilation   GREEN LIGHT LASER TURP (TRANSURETHRAL RESECTION OF PROSTATE     PROSTATE BIOPSY     RADIOACTIVE SEED IMPLANT N/A 08/28/2019   Procedure: RADIOACTIVE SEED IMPLANT/BRACHYTHERAPY IMPLANT;  Surgeon: Irine Seal, MD;  Location: [facility];  Service: Urology;  Laterality: N/A;   SPACE OAR INSTILLATION N/A 08/28/2019   Procedure: SPACE OAR INSTILLATION;  Surgeon: Irine Seal, MD;  Location: [facility];  Service: Urology;  Laterality: N/A;   TONSILLECTOMY     No Known Allergies  Family History  Problem Relation Age of Onset   Bone cancer Mother    Bladder Cancer Sister    Rectal cancer Brother    Bladder Cancer Brother    Diabetes Neg Hx    Heart disease Neg Hx    Hyperlipidemia Neg Hx    Cancer Neg Hx    Breast cancer Neg Hx    Pancreatic cancer Neg Hx    Social History   Socioeconomic History   Marital status: Widowed    Spouse name: Not on file   Number of children: 2   Years of education: Not on file   Highest education level: Not on file  Occupational History   Occupation: Furniture conservator/restorer    Comment: retired  Tobacco Use   Smoking status: Former    Packs/day: 1.00    Years: 60.00    Pack years: 60.00    Types: Cigarettes    Start date: 07/25/1959    Quit date: 10/07/2015    Years since quitting: 6.1   Smokeless tobacco: Never  Vaping Use   Vaping Use: Never used  Substance and Sexual Activity   Alcohol use: Yes    Comment: occassionally   Drug use: Not Currently   Sexual activity: Not Currently  Other Topics Concern   Not on file  Social History Narrative   Not on file   Social Determinants of Health   Financial Resource Strain:  Low Risk    Difficulty of Paying Living Expenses: Not hard at all  Food Insecurity: No Food Insecurity   Worried About Charity fundraiser in the Last Year: Never true   [place] in the Last Year: Never true  Transportation Needs: No Transportation Needs   Lack of Transportation (Medical): No   Lack of Transportation (Non-Medical): No  Physical Activity: Sufficiently Active   Days of Exercise per Week: 5 days   Minutes of Exercise per Session: 60 min  Stress: No Stress Concern Present   Feeling of Stress : Not at all  Social Connections: Socially Isolated   Frequency of Communication with Friends and Family: More than three times a week   Frequency of Social Gatherings with Friends and Family: More than three times a  week   Attends Religious Services: Never   Active Member of Clubs or Organizations: No   Attends Archivist Meetings: Never   Marital Status: Widowed   Vitals:   12/06/21 0853  BP: 118/70  Pulse: 76  Resp: 16  Temp: 97.8 F (36.6 C)  SpO2: 98%   Body mass index is 27.33 kg/m.  Wt Readings from Last 3 Encounters:  12/06/21 201 lb 8 oz (91.4 kg)  11/25/20 196 lb 12.8 oz (89.3 kg)  11/10/19 193 lb (87.5 kg)   Physical Exam Vitals and nursing note reviewed.  Constitutional:      General: He is not in acute distress.    Appearance: He is well-developed.  HENT:     Head: Normocephalic and atraumatic.     Right Ear: Tympanic membrane, ear canal and external ear normal.     Left Ear: Tympanic membrane, ear canal and external ear normal.     Mouth/Throat:     Mouth: Mucous membranes are moist.     Pharynx: Oropharynx is clear.  Eyes:     Conjunctiva/sclera: Conjunctivae normal.     Pupils: Pupils are equal, round, and reactive to light.  Neck:     Thyroid: No thyroid mass.     Trachea: No tracheal deviation.  Cardiovascular:     Rate and Rhythm: Normal rate. Rhythm irregular. Occasional Extrasystoles are present.    Heart sounds: No  murmur heard.    Comments: DP's hard to find, L>R but present. Normal capillary refill. Trace pitting LE edema, bilateral. Peri ankle pitting edema, L>R. Pulmonary:     Effort: Pulmonary effort is normal. No respiratory distress.     Breath sounds: Normal breath sounds.  Abdominal:     Palpations: Abdomen is soft. There is no hepatomegaly or mass.     Tenderness: There is no abdominal tenderness.  Musculoskeletal:        General: No tenderness.     Cervical back: Normal range of motion.     Comments: No signs of synovitis.  Lymphadenopathy:     Cervical: No cervical adenopathy.  Skin:    General: Skin is warm.     Findings: No erythema.          Comments:  SK scattered on upper chest and some on back.  Neurological:     Mental Status: He is alert and oriented to person, place, and time.     Cranial Nerves: No cranial nerve deficit.     Coordination: Coordination normal.     Gait: Gait normal.     Deep Tendon Reflexes:     Reflex Scores:      Bicep reflexes are 2+ on the right side and 2+ on the left side.      Patellar reflexes are 2+ on the right side and 2+ on the left side.    Comments: Mildly unstable gait, not assisted.   ASSESSMENT AND PLAN:  [name] was seen today for annual exam and follow-up.  Diagnoses and all orders for this visit: Orders Placed This Encounter  Procedures   Pneumococcal polysaccharide vaccine 23-valent greater than or equal to 2yo subcutaneous/IM   Hepatic function panel   Basic metabolic panel   Lipid panel   CBC   Ambulatory referral to Vascular Surgery   VAS [place] LOWER EXTREMITY ARTERIAL DUPLEX   Lab Results  Component Value Date   CHOL 211 (H) 12/06/2021   HDL 55.50 12/06/2021   LDLCALC 123 (H) 12/06/2021   TRIG  164.0 (H) 12/06/2021   CHOLHDL 4 12/06/2021   Lab Results  Component Value Date   CREATININE 1.11 12/06/2021   BUN 16 12/06/2021   NA 133 (L) 12/06/2021   K 4.2 12/06/2021   CL 100 12/06/2021   CO2 27 12/06/2021    Lab Results  Component Value Date   ALT 15 12/06/2021   AST 14 12/06/2021   ALKPHOS 73 12/06/2021   BILITOT 0.7 12/06/2021   Lab Results  Component Value Date   WBC 7.1 12/06/2021   HGB 13.6 12/06/2021   HCT 40.3 12/06/2021   MCV 96.4 12/06/2021   PLT 318.0 12/06/2021   Routine general medical examination at a health care facility We discussed the importance of regular physical activity and healthy diet for prevention of chronic illness and/or complications. Preventive guidelines reviewed. Vaccination updated. Recommend shingrix at his pharmacy. He has had abdominal CT and MRI in 12/2018 and 09/2019 respectively, ordered by urologist. I do not have reports, we will try to obtain copy to see vascular report (aortic aneurysm screening). He is not interested in lung cancer or colon cancer screening. Reassured in regard to skin lesion, it is not zoster infection. Daily skin moisturizer recommended. Next CPE in a year.  Hypertension, essential, benign BP adequately controlled. Continue current management: Amlodipine 5 mg daily. DASH/low salt diet recommended. Continue monitoring BP at home. Periodic eye exam recommended.  -     amLODipine (NORVASC) 5 MG tablet; Take 1.5 tablets (7.5 mg total) by mouth daily.  PAD (peripheral artery disease) ([place]) Having LLE claudication symptoms. We discussed report of last ABI. No signs of acute limp ischemia. Educated about the importance of appropriate foot care, instructed about warning signs. Continue walking as tolerated. Continue Aspirin 81 mg daily, side effects discussed. Atorvastatin to be resumed, dose according to FLP result.  Hyperlipidemia LDL goal <70 We discussed CV benefits of statins. Low fat diet also recommended.  Malignant neoplasm of prostate [facility]) Following with urologist.  Alcohol dependence, uncomplicated ([place]) Encouraged to decreased amount and frequency. We discussed adverse effects when drinking more than  recommended amount.  Need for pneumococcal vaccination -     Pneumococcal polysaccharide vaccine 23-valent greater than or equal to 2yo subcutaneous/IM  Return in 6 months (on 06/08/2022).   Marvette Schamp G. [place], MD  [facility]. [place] office.

## 2021-12-06 ENCOUNTER — Other Ambulatory Visit: Payer: Self-pay | Admitting: Family Medicine

## 2021-12-06 ENCOUNTER — Ambulatory Visit (INDEPENDENT_AMBULATORY_CARE_PROVIDER_SITE_OTHER): Payer: Medicare Other | Admitting: Family Medicine

## 2021-12-06 ENCOUNTER — Encounter: Payer: Self-pay | Admitting: Family Medicine

## 2021-12-06 VITALS — BP 118/70 | HR 76 | Temp 97.8°F | Resp 16 | Ht 72.0 in | Wt 201.5 lb

## 2021-12-06 DIAGNOSIS — I739 Peripheral vascular disease, unspecified: Secondary | ICD-10-CM

## 2021-12-06 DIAGNOSIS — F1020 Alcohol dependence, uncomplicated: Secondary | ICD-10-CM

## 2021-12-06 DIAGNOSIS — I10 Essential (primary) hypertension: Secondary | ICD-10-CM

## 2021-12-06 DIAGNOSIS — Z23 Encounter for immunization: Secondary | ICD-10-CM

## 2021-12-06 DIAGNOSIS — Z0000 Encounter for general adult medical examination without abnormal findings: Secondary | ICD-10-CM

## 2021-12-06 DIAGNOSIS — C61 Malignant neoplasm of prostate: Secondary | ICD-10-CM

## 2021-12-06 DIAGNOSIS — E785 Hyperlipidemia, unspecified: Secondary | ICD-10-CM | POA: Diagnosis not present

## 2021-12-06 LAB — HEPATIC FUNCTION PANEL
ALT: 15 U/L (ref 0–53)
AST: 14 U/L (ref 0–37)
Albumin: 4.4 g/dL (ref 3.5–5.2)
Alkaline Phosphatase: 73 U/L (ref 39–117)
Bilirubin, Direct: 0.2 mg/dL (ref 0.0–0.3)
Total Bilirubin: 0.7 mg/dL (ref 0.2–1.2)
Total Protein: 6.9 g/dL (ref 6.0–8.3)

## 2021-12-06 LAB — BASIC METABOLIC PANEL
BUN: 16 mg/dL (ref 6–23)
CO2: 27 mEq/L (ref 19–32)
Calcium: 10 mg/dL (ref 8.4–10.5)
Chloride: 100 mEq/L (ref 96–112)
Creatinine, Ser: 1.11 mg/dL (ref 0.40–1.50)
GFR: 65.14 mL/min (ref >60.00)
Glucose, Bld: 114 mg/dL — ABNORMAL HIGH (ref 70–99)
Potassium: 4.2 mEq/L (ref 3.5–5.1)
Sodium: 133 mEq/L — ABNORMAL LOW (ref 135–145)

## 2021-12-06 LAB — LIPID PANEL
Cholesterol: 211 mg/dL — ABNORMAL HIGH (ref 0–200)
HDL: 55.5 mg/dL (ref >39.00)
LDL Cholesterol: 123 mg/dL — ABNORMAL HIGH (ref 0–99)
NonHDL: 155.35
Total CHOL/HDL Ratio: 4
Triglycerides: 164 mg/dL — ABNORMAL HIGH (ref 0.0–149.0)
VLDL: 32.8 mg/dL (ref 0.0–40.0)

## 2021-12-06 LAB — CBC
HCT: 40.3 % (ref 39.0–52.0)
Hemoglobin: 13.6 g/dL (ref 13.0–17.0)
MCHC: 33.6 g/dL (ref 30.0–36.0)
MCV: 96.4 fl (ref 78.0–100.0)
Platelets: 318 10*3/uL (ref 150.0–400.0)
RBC: 4.18 Mil/uL — ABNORMAL LOW (ref 4.22–5.81)
RDW: 13.3 % (ref 11.5–15.5)
WBC: 7.1 10*3/uL (ref 4.0–10.5)

## 2021-12-06 NOTE — Patient Instructions (Addendum)
A few things to remember from today's visit: ? ?Routine general medical examination at a health care facility ? ?Hypertension, essential, benign - Plan: Hepatic function panel, Basic metabolic panel ? ?PAD (peripheral artery disease) ([place]) - Plan: Ambulatory referral to Vascular Surgery, VAS [place] LOWER EXTREMITY ARTERIAL DUPLEX, Lipid panel ? ?Malignant neoplasm of prostate ([place]), Chronic ? ?If you need refills please call your pharmacy. ?Do not use My Chart to request refills or for acute issues that need immediate attention. ?  ?You need to resume Atorvastatin. ?Continue Aspirin 81 mg daily. ?Appt with vascular will be arranged. ?Try to decrease alcohol intake. ?You can take Tylenol 500 mg 3-4 tab max daily as needed for pain. ? ?Please be sure medication list is accurate. ?If a new problem present, please set up appointment sooner than planned today. ?Preventive Care 74 Years and Older, Male ?Preventive care refers to lifestyle choices and visits with your health care provider that can promote health and wellness. Preventive care visits are also called wellness exams. ?What can I expect for my preventive care visit? ?Counseling ?During your preventive care visit, your health care provider may ask about your: ?Medical history, including: ?Past medical problems. ?Family medical history. ?History of falls. ?Current health, including: ?Emotional well-being. ?Home life and relationship well-being. ?Sexual activity. ?Memory and ability to understand (cognition). ?Lifestyle, including: ?Alcohol, nicotine or tobacco, and drug use. ?Access to firearms. ?Diet, exercise, and sleep habits. ?Work and work Statistician. ?Sunscreen use. ?Safety issues such as seatbelt and bike helmet use. ?Physical exam ?Your health care provider will check your: ?Height and weight. These may be used to calculate your BMI (body mass index). BMI is a measurement that tells if you are at a healthy weight. ?Waist circumference. This measures the  distance around your waistline. This measurement also tells if you are at a healthy weight and may help predict your risk of certain diseases, such as type 2 diabetes and high blood pressure. ?Heart rate and blood pressure. ?Body temperature. ?Skin for abnormal spots. ?What immunizations do I need? ? ?Vaccines are usually given at various ages, according to a schedule. Your health care provider will recommend vaccines for you based on your age, medical history, and lifestyle or other factors, such as travel or where you work. ?What tests do I need? ?Screening ?Your health care provider may recommend screening tests for certain conditions. This may include: ?Lipid and cholesterol levels. ?Diabetes screening. This is done by checking your blood sugar (glucose) after you have not eaten for a while (fasting). ?Hepatitis C test. ?Hepatitis B test. ?HIV (human immunodeficiency virus) test. ?STI (sexually transmitted infection) testing, if you are at risk. ?Lung cancer screening. ?Colorectal cancer screening. ?Prostate cancer screening. ?Abdominal aortic aneurysm (AAA) screening. You may need this if you are a current or former smoker. ?Talk with your health care provider about your test results, treatment options, and if necessary, the need for more tests. ?Follow these instructions at home: ?Eating and drinking ? ?Eat a diet that includes fresh fruits and vegetables, whole grains, lean protein, and low-fat dairy products. Limit your intake of foods with high amounts of sugar, saturated fats, and salt. ?Take vitamin and mineral supplements as recommended by your health care provider. ?Do not drink alcohol if your health care provider tells you not to drink. ?If you drink alcohol: ?Limit how much you have to 0-2 drinks a day. ?Know how much alcohol is in your drink. In the [place] one drink equals one 12 oz bottle of  beer (355 mL), one 5 oz glass of wine (148 mL), or one 1? oz glass of hard liquor (44 mL). ?Lifestyle ?Brush  your teeth every morning and night with fluoride toothpaste. Floss one time each day. ?Exercise for at least 30 minutes 5 or more days each week. ?Do not use any products that contain nicotine or tobacco. These products include cigarettes, chewing tobacco, and vaping devices, such as e-cigarettes. If you need help quitting, ask your health care provider. ?Do not use drugs. ?If you are sexually active, practice safe sex. Use a condom or other form of protection to prevent STIs. ?Take aspirin only as told by your health care provider. Make sure that you understand how much to take and what form to take. Work with your health care provider to find out whether it is safe and beneficial for you to take aspirin daily. ?Ask your health care provider if you need to take a cholesterol-lowering medicine (statin). ?Find healthy ways to manage stress, such as: ?Meditation, yoga, or listening to music. ?Journaling. ?Talking to a trusted person. ?Spending time with friends and family. ?Safety ?Always wear your seat belt while driving or riding in a vehicle. ?Do not drive: ?If you have been drinking alcohol. Do not ride with someone who has been drinking. ?When you are tired or distracted. ?While texting. ?If you have been using any mind-altering substances or drugs. ?Wear a helmet and other protective equipment during sports activities. ?If you have firearms in your house, make sure you follow all gun safety procedures. ?Minimize exposure to UV radiation to reduce your risk of skin cancer. ?What's next? ?Visit your health care provider once a year for an annual wellness visit. ?Ask your health care provider how often you should have your eyes and teeth checked. ?Stay up to date on all vaccines. ?This information is not intended to replace advice given to you by your health care provider. Make sure you discuss any questions you have with your health care provider. ?Document Revised: 01/05/2021 Document Reviewed:  01/05/2021 ?Elsevier Patient Education ? [place]. ? ? ? ? ? ? ? ?

## 2021-12-07 MED ORDER — AMLODIPINE BESYLATE 5 MG PO TABS: 7.5000 mg | ORAL_TABLET | Freq: Every day | ORAL | 2 refills | Status: DC

## 2021-12-08 ENCOUNTER — Encounter: Payer: Self-pay | Admitting: Family Medicine

## 2021-12-08 MED ORDER — ATORVASTATIN CALCIUM 40 MG PO TABS: 40.0000 mg | ORAL_TABLET | Freq: Every day | ORAL | 3 refills | Status: DC

## 2021-12-27 ENCOUNTER — Other Ambulatory Visit (HOSPITAL_COMMUNITY): Payer: Self-pay | Admitting: Family Medicine

## 2021-12-27 DIAGNOSIS — I739 Peripheral vascular disease, unspecified: Secondary | ICD-10-CM

## 2021-12-28 ENCOUNTER — Encounter (HOSPITAL_COMMUNITY): Payer: Medicare Other

## 2022-01-06 ENCOUNTER — Ambulatory Visit (INDEPENDENT_AMBULATORY_CARE_PROVIDER_SITE_OTHER): Payer: Medicare Other

## 2022-01-06 VITALS — Ht 72.0 in | Wt 201.0 lb

## 2022-01-06 DIAGNOSIS — Z0000 Encounter for general adult medical examination without abnormal findings: Secondary | ICD-10-CM

## 2022-01-06 NOTE — Progress Notes (Signed)
Subjective:   Kermitt Harjo is a 76 y.o. male who presents for Medicare Annual/Subsequent preventive examination.  Review of Systems    Virtual Visit via Telephone Note  I connected with  Dion Body on 01/06/22 at 12:30 PM EDT by telephone and verified that I am speaking with the correct person using two identifiers.  Location: Patient: Home Provider: Office Persons participating in the virtual visit: patient/Nurse Health Advisor   I discussed the limitations, risks, security and privacy concerns of performing an evaluation and management service by telephone and the availability of in person appointments. The patient expressed understanding and agreed to proceed.  Interactive audio and video telecommunications were attempted between this nurse and patient, however failed, due to patient having technical difficulties OR patient did not have access to video capability.  We continued and completed visit with audio only.  Some vital signs may be absent or patient reported.   Criselda Peaches, LPN  Cardiac Risk Factors include: advanced age (>80mn, >>25women);hypertension;male gender     Objective:    Today's Vitals   01/06/22 1233  Weight: 201 lb (91.2 kg)  Height: 6' (1.829 m)   Body mass index is 27.26 kg/m.     01/06/2022   12:45 PM 01/05/2021   11:06 AM 09/25/2019   10:23 AM 08/28/2019    6:00 AM 06/17/2019    9:34 AM 12/11/2018    7:18 PM  Advanced Directives  Does Patient Have a Medical Advance Directive? Yes Yes No Yes Yes Yes  Type of AParamedicof [place] will Healthcare Power of [place] Living will;Healthcare Power of Attorney  Does patient want to make changes to medical advance directive? No - Patient declined  No - Patient declined No - Guardian declined No - Patient declined   Copy of [place] Chart? No - copy requested No - copy requested   No - copy requested  No - copy requested  Would patient like information on creating a medical advance directive?   No - Guardian declined       Current Medications (verified) Outpatient Encounter Medications as of 01/06/2022  Medication Sig   acetaminophen (TYLENOL) 500 MG tablet Take 500 mg by mouth as needed.   amLODipine (NORVASC) 5 MG tablet Take 1.5 tablets (7.5 mg total) by mouth daily.   aspirin 81 MG EC tablet Take 1 tablet (81 mg total) by mouth daily. Swallow whole.   atorvastatin (LIPITOR) 40 MG tablet Take 1 tablet (40 mg total) by mouth daily.   Boswellia-Glucosamine-Vit D (OSTEO BI-FLEX ONE PER DAY PO) Take 1 tablet by mouth daily.   loratadine-pseudoephedrine (CLARITIN-D 24-HOUR) 10-240 MG 24 hr tablet Take 1 tablet by mouth daily as needed for allergies.   Multiple Vitamins-Minerals (MENS MULTIVITAMIN) TABS Take 1 tablet by mouth daily.   silodosin (RAPAFLO) 8 MG CAPS capsule Take 8 mg by mouth daily with breakfast.   No facility-administered encounter medications on file as of 01/06/2022.    Allergies (verified) Patient has no known allergies.   History: Past Medical History:  Diagnosis Date   Hypertension    Prostate cancer ([place]    Past Surgical History:  Procedure Laterality Date   ANKLE SURGERY     bilateral   CYSTOSCOPY     with dilation   GREEN LIGHT LASER TURP (TRANSURETHRAL RESECTION OF PROSTATE     PROSTATE BIOPSY     RADIOACTIVE SEED IMPLANT N/A 08/28/2019  Procedure: RADIOACTIVE SEED IMPLANT/BRACHYTHERAPY IMPLANT;  Surgeon: Irine Seal, MD;  Location: [facility];  Service: Urology;  Laterality: N/A;   SPACE OAR INSTILLATION N/A 08/28/2019   Procedure: SPACE OAR INSTILLATION;  Surgeon: Irine Seal, MD;  Location: [facility];  Service: Urology;  Laterality: N/A;   TONSILLECTOMY     Family History  Problem Relation Age of Onset   Bone cancer Mother    Bladder Cancer Sister    Rectal cancer Brother    Bladder Cancer Brother    Diabetes  Neg Hx    Heart disease Neg Hx    Hyperlipidemia Neg Hx    Cancer Neg Hx    Breast cancer Neg Hx    Pancreatic cancer Neg Hx    Social History   Socioeconomic History   Marital status: Widowed    Spouse name: Not on file   Number of children: 2   Years of education: Not on file   Highest education level: Not on file  Occupational History   Occupation: Furniture conservator/restorer    Comment: retired  Tobacco Use   Smoking status: Former    Packs/day: 1.00    Years: 60.00    Total pack years: 60.00    Types: Cigarettes    Start date: 07/25/1959    Quit date: 10/07/2015    Years since quitting: 6.2   Smokeless tobacco: Never  Vaping Use   Vaping Use: Never used  Substance and Sexual Activity   Alcohol use: Yes    Comment: occassionally   Drug use: Not Currently   Sexual activity: Not Currently  Other Topics Concern   Not on file  Social History Narrative   Not on file   Social Determinants of Health   Financial Resource Strain: Low Risk  (01/06/2022)   Overall Financial Resource Strain (CARDIA)    Difficulty of Paying Living Expenses: Not hard at all  Food Insecurity: No Food Insecurity (01/06/2022)   Hunger Vital Sign    Worried About Running Out of Food in the Last Year: Never true    [place] in the Last Year: Never true  Transportation Needs: No Transportation Needs (01/06/2022)   PRAPARE - Hydrologist (Medical): No    Lack of Transportation (Non-Medical): No  Physical Activity: Sufficiently Active (01/06/2022)   Exercise Vital Sign    Days of Exercise per Week: 7 days    Minutes of Exercise per Session: 150+ min  Stress: No Stress Concern Present (01/06/2022)   [place]    Feeling of Stress : Not at all  Social Connections: Socially Isolated (01/06/2022)   Social Connection and Isolation Panel [NHANES]    Frequency of Communication with Friends and Family: More than three times  a week    Frequency of Social Gatherings with Friends and Family: More than three times a week    Attends Religious Services: Never    Marine scientist or Organizations: No    Attends Archivist Meetings: Never    Marital Status: Widowed    Tobacco Counseling Counseling given: Not Answered   Clinical Intake:  Pre-visit preparation completed: NoDiabetic?  No  Interpreter Needed?: No Activities of Daily Living    01/06/2022   12:41 PM  In your present state of health, do you have any difficulty performing the following activities:  Hearing? 1  Comment Wears hearing aids  Vision? 0  Difficulty  concentrating or making decisions? 0  Walking or climbing stairs? 1  Comment Due to past ankle surgery. Followed by PCP  Dressing or bathing? 0  Doing errands, shopping? 0  Preparing Food and eating ? N  Using the Toilet? N  In the past six months, have you accidently leaked urine? N  Do you have problems with loss of bowel control? N  Managing your Medications? N  Managing your Finances? N  Housekeeping or managing your Housekeeping? N    Patient Care Team: [place], Betty G, MD as PCP - General (Family Medicine) Irine Seal, MD as Consulting Physician (Urology) Tyler Pita, MD as Consulting Physician (Radiation Oncology) Cira Rue, RN Nurse Navigator as Registered Nurse (Medical Oncology) Lavonna Monarch, MD as Consulting Physician (Dermatology)  Indicate any recent Medical Services you may have received from other than [facility] providers in the past year (date may be approximate).     Assessment:   This is a routine wellness examination for K[place]ay.  Hearing/Vision screen Hearing Screening - Comments:: Wears hearing aids Vision Screening - Comments:: Wears glasses. Followed by Suzie Portela  Dietary issues and exercise activities discussed: Exercise limited by: None identified   Goals Addressed               This Visit's Progress     Patient Stated  (pt-stated)        I would like to go to the [place]    01/06/2022   12:39 PM 12/06/2021    9:03 AM 01/05/2021   11:05 AM 11/10/2019    4:32 PM  PHQ 2/9 Scores  PHQ - 2 Score 0 0 1 0    Fall Risk    01/06/2022   12:44 PM 12/06/2021    9:03 AM 01/05/2021   11:07 AM 11/10/2019    4:32 PM  Fall Risk   Falls in the past year? 0 0 0 0  Number falls in past yr: 0 0 0 0  Injury with Fall? 0 0 0 0  Risk for fall due to : No Fall Risks No Fall Risks Impaired vision   Follow up  Falls evaluation completed Falls prevention discussed Education provided    [place]:  Any stairs in or around the home? Yes  If so, are there any without handrails? No  Home free of loose throw rugs in walkways, pet beds, electrical cords, etc? Yes  Adequate lighting in your home to reduce risk of falls? Yes   ASSISTIVE DEVICES UTILIZED TO PREVENT FALLS:  Life alert? No  Use of a cane, walker or w/c? No  Grab bars in the bathroom? Yes  Shower chair or bench in shower? Yes  Elevated toilet seat or a handicapped toilet? Yes   TIMED UP AND GO:  Was the test performed? No . Audio Visit  Cognitive Function:      Immunizations Immunization History  Administered Date(s) Administered   Pneumococcal Polysaccharide-23 12/06/2021      Flu Vaccine status: Declined, Education has been provided regarding the importance of this vaccine but patient still declined. Advised may receive this vaccine at local pharmacy or Health Dept. Aware to provide a copy of the vaccination record if obtained from local pharmacy or Health Dept. Verbalized acceptance and understanding.  Pneumococcal vaccine status: Up to date  Covid-19 vaccine status: Declined, Education has been provided regarding the importance of this vaccine but patient still declined. Advised may receive  this vaccine at local pharmacy or Health Dept.or vaccine clinic. Aware to provide a copy of the  vaccination record if obtained from local pharmacy or Health Dept. Verbalized acceptance and understanding.  Qualifies for Shingles Vaccine? Yes   Zostavax completed No   Shingrix Completed?: No.    Education has been provided regarding the importance of this vaccine. Patient has been advised to call insurance company to determine out of pocket expense if they have not yet received this vaccine. Advised may also receive vaccine at local pharmacy or Health Dept. Verbalized acceptance and understanding.  Screening Tests Health Maintenance  Topic Date Due   COVID-19 Vaccine (1) 01/22/2022 (Originally 05/01/1946)   Zoster Vaccines- Shingrix (1 of 2) 07/27/2022 (Originally 10/29/1964)   INFLUENZA VACCINE  02/21/2022   Pneumonia Vaccine 25+ Years old (2 - PCV) 12/07/2022   Hepatitis C Screening  Completed   HPV VACCINES  Aged Out   COLONOSCOPY (Pts 45-25yr Insurance coverage will need to be confirmed)  Discontinued   TETANUS/TDAP  Discontinued    Health Maintenance  There are no preventive care reminders to display for this patient.     Lung Cancer Screening: (Low Dose CT Chest recommended if Age 75-80years, 30 pack-year currently smoking OR have quit w/in 15years.) does not qualify.   Additional Screening:  Hepatitis C Screening: does qualify; Completed 11/25/20  Vision Screening: Recommended annual ophthalmology exams for early detection of glaucoma and other disorders of the eye. Is the patient up to date with their annual eye exam?  Yes  Who is the provider or what is the name of the office in which the patient attends annual eye exams? [email or web address] not established with a provider, would they like to be referred to a provider to establish care? No .   Dental Screening: Recommended annual dental exams for proper oral hygiene  Community Resource Referral / Chronic Care Management:  CRR required this visit?  No   CCM required this visit?  No      Plan:     I  have personally reviewed and noted the following in the patient's chart:   Medical and social history Use of alcohol, tobacco or illicit drugs  Current medications and supplements including opioid prescriptions. Patient is not currently taking opioid prescriptions. Functional ability and status Nutritional status Physical activity Advanced directives List of other physicians Hospitalizations, surgeries, and ER visits in previous 12 months Vitals Screenings to include cognitive, depression, and falls Referrals and appointments  In addition, I have reviewed and discussed with patient certain preventive protocols, quality metrics, and best practice recommendations. A written personalized care plan for preventive services as well as general preventive health recommendations were provided to patient.     BCriselda Peaches LPN   [id number]  Nurse Notes: None

## 2022-01-06 NOTE — Patient Instructions (Signed)
[name] , Thank you for taking time to come for your Medicare Wellness Visit. I appreciate your ongoing commitment to your health goals. Please review the following plan we discussed and let me know if I can assist you in the future.   These are the goals we discussed:  Goals      Patient Stated     None at this time        This is a list of the screening recommended for you and due dates:  Health Maintenance  Topic Date Due   COVID-19 Vaccine (1) Never done   Zoster (Shingles) Vaccine (1 of 2) 07/27/2022*   Flu Shot  02/21/2022   Pneumonia Vaccine (2 - PCV) 12/07/2022   Hepatitis C Screening: USPSTF Recommendation to screen - Ages 75-79 yo.  Completed   HPV Vaccine  Aged Out   Colon Cancer Screening  Discontinued   Tetanus Vaccine  Discontinued  *Topic was postponed. The date shown is not the original due date.   Advanced directives: Yes  Conditions/risks identified: None  Next appointment: Follow up in one year for your annual wellness visit.   Preventive Care 75 Years and Older, Male Preventive care refers to lifestyle choices and visits with your health care provider that can promote health and wellness. What does preventive care include? A yearly physical exam. This is also called an annual well check. Dental exams once or twice a year. Routine eye exams. Ask your health care provider how often you should have your eyes checked. Personal lifestyle choices, including: Daily care of your teeth and gums. Regular physical activity. Eating a healthy diet. Avoiding tobacco and drug use. Limiting alcohol use. Practicing safe sex. Taking low doses of aspirin every day. Taking vitamin and mineral supplements as recommended by your health care provider. What happens during an annual well check? The services and screenings done by your health care provider during your annual well check will depend on your age, overall health, lifestyle risk factors, and family history of  disease. Counseling  Your health care provider may ask you questions about your: Alcohol use. Tobacco use. Drug use. Emotional well-being. Home and relationship well-being. Sexual activity. Eating habits. History of falls. Memory and ability to understand (cognition). Work and work Statistician. Screening  You may have the following tests or measurements: Height, weight, and BMI. Blood pressure. Lipid and cholesterol levels. These may be checked every 5 years, or more frequently if you are over 31 years old. Skin check. Lung cancer screening. You may have this screening every year starting at age 42 if you have a 30-pack-year history of smoking and currently smoke or have quit within the past 15 years. Fecal occult blood test (FOBT) of the stool. You may have this test every year starting at age 64. Flexible sigmoidoscopy or colonoscopy. You may have a sigmoidoscopy every 5 years or a colonoscopy every 10 years starting at age 88. Prostate cancer screening. Recommendations will vary depending on your family history and other risks. Hepatitis C blood test. Hepatitis B blood test. Sexually transmitted disease (STD) testing. Diabetes screening. This is done by checking your blood sugar (glucose) after you have not eaten for a while (fasting). You may have this done every 1-3 years. Abdominal aortic aneurysm (AAA) screening. You may need this if you are a current or former smoker. Osteoporosis. You may be screened starting at age 54 if you are at high risk. Talk with your health care provider about your test results,  treatment options, and if necessary, the need for more tests. Vaccines  Your health care provider may recommend certain vaccines, such as: Influenza vaccine. This is recommended every year. Tetanus, diphtheria, and acellular pertussis (Tdap, Td) vaccine. You may need a Td booster every 10 years. Zoster vaccine. You may need this after age 62. Pneumococcal 13-valent  conjugate (PCV13) vaccine. One dose is recommended after age 22. Pneumococcal polysaccharide (PPSV23) vaccine. One dose is recommended after age 47. Talk to your health care provider about which screenings and vaccines you need and how often you need them. This information is not intended to replace advice given to you by your health care provider. Make sure you discuss any questions you have with your health care provider. Document Released: 08/06/2015 Document Revised: 03/29/2016 Document Reviewed: 05/11/2015 Elsevier Interactive Patient Education  2017 [employer] Prevention in the Home Falls can cause injuries. They can happen to people of all ages. There are many things you can do to make your home safe and to help prevent falls. What can I do on the outside of my home? Regularly fix the edges of walkways and driveways and fix any cracks. Remove anything that might make you trip as you walk through a door, such as a raised step or threshold. Trim any bushes or trees on the path to your home. Use bright outdoor lighting. Clear any walking paths of anything that might make someone trip, such as rocks or tools. Regularly check to see if handrails are loose or broken. Make sure that both sides of any steps have handrails. Any raised decks and porches should have guardrails on the edges. Have any leaves, snow, or ice cleared regularly. Use sand or salt on walking paths during winter. Clean up any spills in your garage right away. This includes oil or grease spills. What can I do in the bathroom? Use night lights. Install grab bars by the toilet and in the tub and shower. Do not use towel bars as grab bars. Use non-skid mats or decals in the tub or shower. If you need to sit down in the shower, use a plastic, non-slip stool. Keep the floor dry. Clean up any water that spills on the floor as soon as it happens. Remove soap buildup in the tub or shower regularly. Attach bath mats  securely with double-sided non-slip rug tape. Do not have throw rugs and other things on the floor that can make you trip. What can I do in the bedroom? Use night lights. Make sure that you have a light by your bed that is easy to reach. Do not use any sheets or blankets that are too big for your bed. They should not hang down onto the floor. Have a firm chair that has side arms. You can use this for support while you get dressed. Do not have throw rugs and other things on the floor that can make you trip. What can I do in the kitchen? Clean up any spills right away. Avoid walking on wet floors. Keep items that you use a lot in easy-to-reach places. If you need to reach something above you, use a strong step stool that has a grab bar. Keep electrical cords out of the way. Do not use floor polish or wax that makes floors slippery. If you must use wax, use non-skid floor wax. Do not have throw rugs and other things on the floor that can make you trip. What can I do with my stairs? Do  not leave any items on the stairs. Make sure that there are handrails on both sides of the stairs and use them. Fix handrails that are broken or loose. Make sure that handrails are as long as the stairways. Check any carpeting to make sure that it is firmly attached to the stairs. Fix any carpet that is loose or worn. Avoid having throw rugs at the top or bottom of the stairs. If you do have throw rugs, attach them to the floor with carpet tape. Make sure that you have a light switch at the top of the stairs and the bottom of the stairs. If you do not have them, ask someone to add them for you. What else can I do to help prevent falls? Wear shoes that: Do not have high heels. Have rubber bottoms. Are comfortable and fit you well. Are closed at the toe. Do not wear sandals. If you use a stepladder: Make sure that it is fully opened. Do not climb a closed stepladder. Make sure that both sides of the stepladder  are locked into place. Ask someone to hold it for you, if possible. Clearly mark and make sure that you can see: Any grab bars or handrails. First and last steps. Where the edge of each step is. Use tools that help you move around (mobility aids) if they are needed. These include: Canes. Walkers. Scooters. Crutches. Turn on the lights when you go into a dark area. Replace any light bulbs as soon as they burn out. Set up your furniture so you have a clear path. Avoid moving your furniture around. If any of your floors are uneven, fix them. If there are any pets around you, be aware of where they are. Review your medicines with your doctor. Some medicines can make you feel dizzy. This can increase your chance of falling. Ask your doctor what other things that you can do to help prevent falls. This information is not intended to replace advice given to you by your health care provider. Make sure you discuss any questions you have with your health care provider. Document Released: 05/06/2009 Document Revised: 12/16/2015 Document Reviewed: 08/14/2014 Elsevier Interactive Patient Education  2017 [employer].

## 2022-01-11 ENCOUNTER — Ambulatory Visit (HOSPITAL_COMMUNITY)
Admission: RE | Admit: 2022-01-11 | Discharge: 2022-01-11 | Disposition: A | Discharge status: Home / Self Care | Payer: Medicare Other | Source: Ambulatory Visit | Attending: Cardiovascular Disease | Admitting: Cardiovascular Disease

## 2022-01-11 DIAGNOSIS — I739 Peripheral vascular disease, unspecified: Secondary | ICD-10-CM | POA: Diagnosis not present

## 2022-02-03 ENCOUNTER — Encounter: Payer: Self-pay | Admitting: Cardiovascular Disease

## 2022-02-03 ENCOUNTER — Ambulatory Visit: Payer: Medicare Other | Admitting: Cardiovascular Disease

## 2022-02-03 DIAGNOSIS — I10 Essential (primary) hypertension: Secondary | ICD-10-CM

## 2022-02-03 DIAGNOSIS — E785 Hyperlipidemia, unspecified: Secondary | ICD-10-CM | POA: Diagnosis not present

## 2022-02-03 DIAGNOSIS — I739 Peripheral vascular disease, unspecified: Secondary | ICD-10-CM | POA: Diagnosis not present

## 2022-02-03 NOTE — Assessment & Plan Note (Signed)
History of essential hypertension blood pressure measured today at 130/70.  He is on amlodipine.

## 2022-02-03 NOTE — Assessment & Plan Note (Signed)
History of hyperlipidemia not on statin therapy lipid profile performed 12/06/2021 revealing total cholesterol 211, LDL 123 and HDL 55.

## 2022-02-03 NOTE — H&P (View-Only) (Signed)
02/03/2022 Terry Morales   May 08, 1946  [id number]  Primary Physician Martinique, Betty G, MD Primary Cardiologist: Terry Harp MD Terry Morales, [place]  HPI:  Terry Morales is a 76 y.o. mildly overweight widowed Caucasian male father of 2, grandfather of 3 grandchildren who is accompanied by his daughter Terry Morales today.  He was referred by Dr. Betty Martinique, his PCP, for evaluation and treatment of PAD.  He is a retired Furniture conservator/restorer.  His cardiovascular risk factor profile is notable for 55 years of tobacco abuse having quit 5 years ago, treated hypertension hyperlipidemia.  There is no family history for heart disease.  Is never had a heart attack or stroke.  He denies chest pain or shortness of breath.  He has had lower extremity claudication left greater than right for over a year with recent Doppler studies performed 01/11/2022 revealing a right ABI of 0.81 with a moderate lesion in the mid right SFA and a left ABI of 0.56 with an occluded distal left SFA.   Current Meds  Medication Sig   acetaminophen (TYLENOL) 500 MG tablet Take 500 mg by mouth as needed.   amLODipine (NORVASC) 5 MG tablet Take 1.5 tablets (7.5 mg total) by mouth daily.   aspirin 81 MG EC tablet Take 1 tablet (81 mg total) by mouth daily. Swallow whole.   atorvastatin (LIPITOR) 40 MG tablet Take 1 tablet (40 mg total) by mouth daily.   Boswellia-Glucosamine-Vit D (OSTEO BI-FLEX ONE PER DAY PO) Take 1 tablet by mouth daily.   loratadine-pseudoephedrine (CLARITIN-D 24-HOUR) 10-240 MG 24 hr tablet Take 1 tablet by mouth daily as needed for allergies.   Multiple Vitamins-Minerals (MENS MULTIVITAMIN) TABS Take 1 tablet by mouth daily.   silodosin (RAPAFLO) 8 MG CAPS capsule Take 8 mg by mouth daily with breakfast.     No Known Allergies  Social History   Socioeconomic History   Marital status: Widowed    Spouse name: Not on file   Number of children: 2   Years of education: Not on file   Highest education level: Not on  file  Occupational History   Occupation: Furniture conservator/restorer    Comment: retired  Tobacco Use   Smoking status: Former    Packs/day: 1.00    Years: 60.00    Total pack years: 60.00    Types: Cigarettes    Start date: 07/25/1959    Quit date: 10/07/2015    Years since quitting: 6.3   Smokeless tobacco: Never  Vaping Use   Vaping Use: Never used  Substance and Sexual Activity   Alcohol use: Yes    Comment: occassionally   Drug use: Not Currently   Sexual activity: Not Currently  Other Topics Concern   Not on file  Social History Narrative   Not on file   Social Determinants of Health   Financial Resource Strain: Low Risk  (01/06/2022)   Overall Financial Resource Strain (CARDIA)    Difficulty of Paying Living Expenses: Not hard at all  Food Insecurity: No Food Insecurity (01/06/2022)   Hunger Vital Sign    Worried About Running Out of Food in the Last Year: Never true    [place] in the Last Year: Never true  Transportation Needs: No Transportation Needs (01/06/2022)   PRAPARE - Hydrologist (Medical): No    Lack of Transportation (Non-Medical): No  Physical Activity: Sufficiently Active (01/06/2022)   Exercise Vital Sign    Days of  Exercise per Week: 7 days    Minutes of Exercise per Session: 150+ min  Stress: No Stress Concern Present (01/06/2022)   [place]    Feeling of Stress : Not at all  Social Connections: Socially Isolated (01/06/2022)   Social Connection and Isolation Panel [NHANES]    Frequency of Communication with Friends and Family: More than three times a week    Frequency of Social Gatherings with Friends and Family: More than three times a week    Attends Religious Services: Never    Marine scientist or Organizations: No    Attends Archivist Meetings: Never    Marital Status: Widowed  Intimate Partner Violence: Not At Risk (01/06/2022)    Humiliation, Afraid, Rape, and Kick questionnaire    Fear of Current or Ex-Partner: No    Emotionally Abused: No    Physically Abused: No    Sexually Abused: No     Review of Systems: General: negative for chills, fever, night sweats or weight changes.  Cardiovascular: negative for chest pain, dyspnea on exertion, edema, orthopnea, palpitations, paroxysmal nocturnal dyspnea or shortness of breath Dermatological: negative for rash Respiratory: negative for cough or wheezing Urologic: negative for hematuria Abdominal: negative for nausea, vomiting, diarrhea, bright red blood per rectum, melena, or hematemesis Neurologic: negative for visual changes, syncope, or dizziness All other systems reviewed and are otherwise negative except as noted above.    Blood pressure 130/70, pulse 75, height 6' (1.829 m), weight 207 lb 3.2 oz (94 kg), SpO2 100 %.  General appearance: alert and no distress Neck: no adenopathy, no carotid bruit, no JVD, supple, symmetrical, trachea midline, and thyroid not enlarged, symmetric, no tenderness/mass/nodules Lungs: clear to auscultation bilaterally Heart: regular rate and rhythm, S1, S2 normal, no murmur, click, rub or gallop Extremities: extremities normal, atraumatic, no cyanosis or edema Pulses: 2+ and symmetric Skin: Skin color, texture, turgor normal. No rashes or lesions Neurologic: Grossly normal  EKG sinus rhythm at 75 with nonspecific ST and T wave changes.  I personally reviewed this EKG.  ASSESSMENT AND PLAN:   Hypertension, essential, benign History of essential hypertension blood pressure measured today at 130/70.  He is on amlodipine.  PAD (peripheral artery disease) ([place]) Patient was referred to me by Dr. Betty Martinique for symptomatic PAD.  He has had the symptoms for over a year.  His left leg is worse than his right leg.  It is lifestyle limiting.  He did have Doppler studies performed in our office 01/11/2022 revealing a right ABI of 0.81  with a moderate lesion in the mid right SFA and a left ABI of 0.56 with what appears to be an occluded distal left SFA.  He wishes to proceed with peripheral angiography and endovascular therapy.     Terry Harp MD FACP,FACC,FAHA, [facility] 02/03/2022 2:54 PM

## 2022-02-03 NOTE — Progress Notes (Signed)
02/03/2022 Terry Morales   [birth year]  [id number]  Primary Physician Martinique, Betty G, MD Primary Cardiologist: Lorretta Harp MD [name], [place]  HPI:  Terry Morales is a 76 y.o. mildly overweight widowed Caucasian male father of 2, grandfather of 3 grandchildren who is accompanied by his daughter Terry Morales today.  He was referred by Dr. Betty Martinique, his PCP, for evaluation and treatment of PAD.  He is a retired Furniture conservator/restorer.  His cardiovascular risk factor profile is notable for 55 years of tobacco abuse having quit 5 years ago, treated hypertension hyperlipidemia.  There is no family history for heart disease.  Is never had a heart attack or stroke.  He denies chest pain or shortness of breath.  He has had lower extremity claudication left greater than right for over a year with recent Doppler studies performed 01/11/2022 revealing a right ABI of 0.81 with a moderate lesion in the mid right SFA and a left ABI of 0.56 with an occluded distal left SFA.   Current Meds  Medication Sig   acetaminophen (TYLENOL) 500 MG tablet Take 500 mg by mouth as needed.   amLODipine (NORVASC) 5 MG tablet Take 1.5 tablets (7.5 mg total) by mouth daily.   aspirin 81 MG EC tablet Take 1 tablet (81 mg total) by mouth daily. Swallow whole.   atorvastatin (LIPITOR) 40 MG tablet Take 1 tablet (40 mg total) by mouth daily.   Boswellia-Glucosamine-Vit D (OSTEO BI-FLEX ONE PER DAY PO) Take 1 tablet by mouth daily.   loratadine-pseudoephedrine (CLARITIN-D 24-HOUR) 10-240 MG 24 hr tablet Take 1 tablet by mouth daily as needed for allergies.   Multiple Vitamins-Minerals (MENS MULTIVITAMIN) TABS Take 1 tablet by mouth daily.   silodosin (RAPAFLO) 8 MG CAPS capsule Take 8 mg by mouth daily with breakfast.     No Known Allergies  Social History   Socioeconomic History   Marital status: Widowed    Spouse name: Not on file   Number of children: 2   Years of education: Not on file   Highest education level: Not on  file  Occupational History   Occupation: Furniture conservator/restorer    Comment: retired  Tobacco Use   Smoking status: Former    Packs/day: 1.00    Years: 60.00    Total pack years: 60.00    Types: Cigarettes    Start date: 07/25/1959    Quit date: 10/07/2015    Years since quitting: 6.3   Smokeless tobacco: Never  Vaping Use   Vaping Use: Never used  Substance and Sexual Activity   Alcohol use: Yes    Comment: occassionally   Drug use: Not Currently   Sexual activity: Not Currently  Other Topics Concern   Not on file  Social History Narrative   Not on file   Social Determinants of Health   Financial Resource Strain: Low Risk  (01/06/2022)   Overall Financial Resource Strain (CARDIA)    Difficulty of Paying Living Expenses: Not hard at all  Food Insecurity: No Food Insecurity (01/06/2022)   Hunger Vital Sign    Worried About Running Out of Food in the Last Year: Never true    [place] in the Last Year: Never true  Transportation Needs: No Transportation Needs (01/06/2022)   PRAPARE - Hydrologist (Medical): No    Lack of Transportation (Non-Medical): No  Physical Activity: Sufficiently Active (01/06/2022)   Exercise Vital Sign    Days of  Exercise per Week: 7 days    Minutes of Exercise per Session: 150+ min  Stress: No Stress Concern Present (01/06/2022)   [place]    Feeling of Stress : Not at all  Social Connections: Socially Isolated (01/06/2022)   Social Connection and Isolation Panel [NHANES]    Frequency of Communication with Friends and Family: More than three times a week    Frequency of Social Gatherings with Friends and Family: More than three times a week    Attends Religious Services: Never    Marine scientist or Organizations: No    Attends Archivist Meetings: Never    Marital Status: Widowed  Intimate Partner Violence: Not At Risk (01/06/2022)    Humiliation, Afraid, Rape, and Kick questionnaire    Fear of Current or Ex-Partner: No    Emotionally Abused: No    Physically Abused: No    Sexually Abused: No     Review of Systems: General: negative for chills, fever, night sweats or weight changes.  Cardiovascular: negative for chest pain, dyspnea on exertion, edema, orthopnea, palpitations, paroxysmal nocturnal dyspnea or shortness of breath Dermatological: negative for rash Respiratory: negative for cough or wheezing Urologic: negative for hematuria Abdominal: negative for nausea, vomiting, diarrhea, bright red blood per rectum, melena, or hematemesis Neurologic: negative for visual changes, syncope, or dizziness All other systems reviewed and are otherwise negative except as noted above.    Blood pressure 130/70, pulse 75, height 6' (1.829 m), weight 207 lb 3.2 oz (94 kg), SpO2 100 %.  General appearance: alert and no distress Neck: no adenopathy, no carotid bruit, no JVD, supple, symmetrical, trachea midline, and thyroid not enlarged, symmetric, no tenderness/mass/nodules Lungs: clear to auscultation bilaterally Heart: regular rate and rhythm, S1, S2 normal, no murmur, click, rub or gallop Extremities: extremities normal, atraumatic, no cyanosis or edema Pulses: 2+ and symmetric Skin: Skin color, texture, turgor normal. No rashes or lesions Neurologic: Grossly normal  EKG sinus rhythm at 75 with nonspecific ST and T wave changes.  I personally reviewed this EKG.  ASSESSMENT AND PLAN:   Hypertension, essential, benign History of essential hypertension blood pressure measured today at 130/70.  He is on amlodipine.  PAD (peripheral artery disease) ([place]) Patient was referred to me by Dr. Betty Martinique for symptomatic PAD.  He has had the symptoms for over a year.  His left leg is worse than his right leg.  It is lifestyle limiting.  He did have Doppler studies performed in our office 01/11/2022 revealing a right ABI of 0.81  with a moderate lesion in the mid right SFA and a left ABI of 0.56 with what appears to be an occluded distal left SFA.  He wishes to proceed with peripheral angiography and endovascular therapy.     Lorretta Harp MD FACP,FACC,FAHA, [facility] 02/03/2022 2:54 PM

## 2022-02-03 NOTE — Patient Instructions (Signed)
Medication Instructions:  Your physician recommends that you continue on your current medications as directed. Please refer to the Current Medication list given to you today.  *If you need a refill on your cardiac medications before your next appointment, please call your pharmacy*   Lab Work: Your physician recommends that you have labs drawn today: BMET & CBC  If you have labs (blood work) drawn today and your tests are completely normal, you will receive your results only by: [place] (if you have MyChart) OR A paper copy in the mail If you have any lab test that is abnormal or we need to change your treatment, we will call you to review the results.   Testing/Procedures: Dr. Gwenlyn Morales has ordered a CT coronary calcium score.   Test locations:  [place]   This is $99 out of pocket.   Coronary CalciumScan A coronary calcium scan is an imaging test used to look for deposits of calcium and other fatty materials (plaques) in the inner lining of the blood vessels of the heart (coronary arteries). These deposits of calcium and plaques can partly clog and narrow the coronary arteries without producing any symptoms or warning signs. This puts a person at risk for a heart attack. This test can detect these deposits before symptoms develop. Tell a health care provider about: Any allergies you have. All medicines you are taking, including vitamins, herbs, eye drops, creams, and over-the-counter medicines. Any problems you or family members have had with anesthetic medicines. Any blood disorders you have. Any surgeries you have had. Any medical conditions you have. Whether you are pregnant or may be pregnant. What are the risks? Generally, this is a safe procedure. However, problems may occur, including: Harm to a pregnant woman and her unborn baby. This test involves the use of radiation. Radiation exposure can be dangerous to a pregnant woman and her  unborn baby. If you are pregnant, you generally should not have this procedure done. Slight increase in the risk of cancer. This is because of the radiation involved in the test. What happens before the procedure? No preparation is needed for this procedure. What happens during the procedure? You will undress and remove any jewelry around your neck or chest. You will put on a hospital gown. Sticky electrodes will be placed on your chest. The electrodes will be connected to an electrocardiogram (ECG) machine to record a tracing of the electrical activity of your heart. A CT scanner will take pictures of your heart. During this time, you will be asked to lie still and hold your breath for 2-3 seconds while a picture of your heart is being taken. The procedure may vary among health care providers and hospitals. What happens after the procedure? You can get dressed. You can return to your normal activities. It is up to you to get the results of your test. Ask your health care provider, or the department that is doing the test, when your results will be ready. Summary A coronary calcium scan is an imaging test used to look for deposits of calcium and other fatty materials (plaques) in the inner lining of the blood vessels of the heart (coronary arteries). Generally, this is a safe procedure. Tell your health care provider if you are pregnant or may be pregnant. No preparation is needed for this procedure. A CT scanner will take pictures of your heart. You can return to your normal activities after the scan is done. This information is not  intended to replace advice given to you by your health care provider. Make sure you discuss any questions you have with your health care provider. Document Released: 01/06/2008 Document Revised: 05/29/2016 Document Reviewed: 05/29/2016 Elsevier Interactive Patient Education  2017 [place] physician has requested that you have a lower extremity  arterial duplex. This test is an ultrasound of the arteries in the legs. It looks at arterial blood flow in the legs. Allow one hour for Lower Arterial scans. There are no restrictions or special instructions  Your physician has requested that you have an ankle brachial index (ABI). During this test an ultrasound and blood pressure cuff are used to evaluate the arteries that supply the arms and legs with blood. Allow thirty minutes for this exam. There are no restrictions or special instructions. To be done 1-2 weeks after procedure (8/3). These will be done at [place]     Follow-Up: At [facility], you and your health needs are our priority.  As part of our continuing mission to provide you with exceptional heart care, we have created designated Provider Care Teams.  These Care Teams include your primary Cardiologist (physician) and Advanced Practice Providers (APPs -  Physician Assistants and Nurse Practitioners) who all work together to provide you with the care you need, when you need it.  We recommend signing up for the patient portal called "MyChart".  Sign up information is provided on this After Visit Summary.  MyChart is used to connect with patients for Virtual Visits (Telemedicine).  Patients are able to view lab/test results, encounter notes, upcoming appointments, etc.  Non-urgent messages can be sent to your provider as well.   To learn more about what you can do with MyChart, go to [email or web address].    Your next appointment:   2-3 week(s) after PV procedure   The format for your next appointment:   In Person  Provider:   Quay Burow, MD   Other Instructions  [place] [address] [place] [address] Dept: [id number] Loc: [phone]  Terry Morales  02/03/2022  You are scheduled for a Peripheral Angiogram on Thursday, August 3 with Dr.  Quay Morales.  1. Please arrive at the Main Entrance A at [facility]: [name], [name] [address] at 9:30 AM (This time is two hours before your procedure to ensure your preparation). Free valet parking service is available.   Special note: Every effort is made to have your procedure done on time. Please understand that emergencies sometimes delay scheduled procedures.  2. Diet: Do not eat solid foods after midnight.  You may have clear liquids until 5 AM upon the day of the procedure.  3. Labs: You will need to have blood drawn today.  4. Medication instructions in preparation for your procedure:    On the morning of your procedure, take Aspirin and any morning medicines NOT listed above.  You may use sips of water.  5. Plan to go home the same day, you will only stay overnight if medically necessary. 6. You MUST have a responsible adult to drive you home. 7. An adult MUST be with you the first 24 hours after you arrive home. 8. Bring a current list of your medications, and the last time and date medication taken. 9. Bring ID and current insurance cards. 10.Please wear clothes that are easy to get on and off and wear slip-on  shoes.  Thank you for allowing [place] to care for you!   -- [facility]

## 2022-02-03 NOTE — Assessment & Plan Note (Signed)
Patient was referred to me by Dr. Betty Martinique for symptomatic PAD.  He has had the symptoms for over a year.  His left leg is worse than his right leg.  It is lifestyle limiting.  He did have Doppler studies performed in our office 01/11/2022 revealing a right ABI of 0.81 with a moderate lesion in the mid right SFA and a left ABI of 0.56 with what appears to be an occluded distal left SFA.  He wishes to proceed with peripheral angiography and endovascular therapy.

## 2022-02-04 LAB — BASIC METABOLIC PANEL
BUN/Creatinine Ratio: 13 (ref 10–24)
BUN: 16 mg/dL (ref 8–27)
CO2: 23 mmol/L (ref 20–29)
Calcium: 10.2 mg/dL (ref 8.6–10.2)
Chloride: 100 mmol/L (ref 96–106)
Creatinine, Ser: 1.22 mg/dL (ref 0.76–1.27)
Glucose: 97 mg/dL (ref 70–99)
Potassium: 5.3 mmol/L — ABNORMAL HIGH (ref 3.5–5.2)
Sodium: 135 mmol/L (ref 134–144)
eGFR: 62 mL/min/{1.73_m2} (ref >59)

## 2022-02-04 LAB — CBC
Hematocrit: 40.3 % (ref 37.5–51.0)
Hemoglobin: 13.5 g/dL (ref 13.0–17.7)
MCH: 32.3 pg (ref 26.6–33.0)
MCHC: 33.5 g/dL (ref 31.5–35.7)
MCV: 96 fL (ref 79–97)
Platelets: 304 10*3/uL (ref 150–450)
RBC: 4.18 x10E6/uL (ref 4.14–5.80)
RDW: 12.3 % (ref 11.6–15.4)
WBC: 7.2 10*3/uL (ref 3.4–10.8)

## 2022-02-15 ENCOUNTER — Encounter (HOSPITAL_COMMUNITY): Payer: Medicare Other

## 2022-02-15 ENCOUNTER — Other Ambulatory Visit: Payer: Self-pay

## 2022-02-15 DIAGNOSIS — I739 Peripheral vascular disease, unspecified: Secondary | ICD-10-CM

## 2022-02-15 MED ORDER — SODIUM CHLORIDE 0.9% FLUSH: 3.0000 mL | Freq: Two times a day (BID) | INTRAVENOUS | Status: DC

## 2022-02-20 ENCOUNTER — Ambulatory Visit (HOSPITAL_BASED_OUTPATIENT_CLINIC_OR_DEPARTMENT_OTHER)
Admission: RE | Admit: 2022-02-20 | Discharge: 2022-02-20 | Disposition: A | Discharge status: Home / Self Care | Payer: Medicare Other | Source: Ambulatory Visit | Attending: Cardiovascular Disease | Admitting: Cardiovascular Disease

## 2022-02-20 DIAGNOSIS — I10 Essential (primary) hypertension: Secondary | ICD-10-CM | POA: Insufficient documentation

## 2022-02-20 DIAGNOSIS — I739 Peripheral vascular disease, unspecified: Secondary | ICD-10-CM | POA: Insufficient documentation

## 2022-02-21 ENCOUNTER — Telehealth: Payer: Self-pay | Admitting: *Deleted

## 2022-02-21 DIAGNOSIS — R931 Abnormal findings on diagnostic imaging of heart and coronary circulation: Secondary | ICD-10-CM

## 2022-02-21 NOTE — Telephone Encounter (Signed)
Spoke with pt daughter, aware of results. She reports the patient is having surgery Thursday this week. Lexiscan scheduled for tomorrow.

## 2022-02-21 NOTE — Telephone Encounter (Signed)
-----   Message from Lorretta Harp, MD sent at 02/20/2022  6:50 PM EDT ----- CCS elevated at 1397. Order Lexi as baseline

## 2022-02-22 ENCOUNTER — Telehealth: Payer: Self-pay | Admitting: *Deleted

## 2022-02-22 ENCOUNTER — Ambulatory Visit (HOSPITAL_COMMUNITY): Payer: Medicare Other | Attending: Cardiovascular Disease

## 2022-02-22 DIAGNOSIS — R931 Abnormal findings on diagnostic imaging of heart and coronary circulation: Secondary | ICD-10-CM | POA: Insufficient documentation

## 2022-02-22 DIAGNOSIS — I739 Peripheral vascular disease, unspecified: Secondary | ICD-10-CM | POA: Insufficient documentation

## 2022-02-22 DIAGNOSIS — I10 Essential (primary) hypertension: Secondary | ICD-10-CM | POA: Diagnosis not present

## 2022-02-22 DIAGNOSIS — I2510 Atherosclerotic heart disease of native coronary artery without angina pectoris: Secondary | ICD-10-CM | POA: Diagnosis not present

## 2022-02-22 DIAGNOSIS — Z87891 Personal history of nicotine dependence: Secondary | ICD-10-CM | POA: Diagnosis not present

## 2022-02-22 DIAGNOSIS — R0609 Other forms of dyspnea: Secondary | ICD-10-CM | POA: Insufficient documentation

## 2022-02-22 LAB — MYOCARDIAL PERFUSION IMAGING
LV dias vol: 108 mL (ref 62–150)
LV sys vol: 55 mL
Nuc Stress EF: 50 %
Peak HR: 75 {beats}/min
Rest HR: 69 {beats}/min
Rest Nuclear Isotope Dose: 9.6 mCi
SDS: 1
SRS: 0
SSS: 1
Stress Nuclear Isotope Dose: 31.7 mCi
TID: 1.08

## 2022-02-22 MED ORDER — REGADENOSON 0.4 MG/5ML IV SOLN
0.4000 mg | Freq: Once | INTRAVENOUS | Status: AC
  Administered 2022-02-22: 0.4 mg via INTRAVENOUS

## 2022-02-22 MED ORDER — TECHNETIUM TC 99M TETROFOSMIN IV KIT
31.7000 | PACK | Freq: Once | INTRAVENOUS | Status: AC | PRN
  Administered 2022-02-22: 31.7 via INTRAVENOUS

## 2022-02-22 MED ORDER — TECHNETIUM TC 99M TETROFOSMIN IV KIT
9.6000 | PACK | Freq: Once | INTRAVENOUS | Status: AC | PRN
  Administered 2022-02-22: 9.6 via INTRAVENOUS

## 2022-02-22 NOTE — Telephone Encounter (Addendum)
Abdominal aortogram scheduled at [facility] for: Thursday February 23, 2022 11:30 AM Arrival time and place: [place] Entrance A at: 9:30 AM   Nothing to eat after midnight prior to procedure, clear liquids until 5 AM day of procedure.  Medication instructions: -Usual morning medications can be taken with sips of water including aspirin 81 mg.  Confirmed patient has responsible adult to drive home post procedure and be with patient first 24 hours after arriving home.*  Patient reports no new symptoms concerning for COVID-19 in the past 10 days.  Reviewed procedure instructions with patient's daughter (DPR), [name].  Per Dr Nanci Pina looks fine, okay to proceed with procedure 02/23/22, daughter aware.

## 2022-02-23 ENCOUNTER — Ambulatory Visit (HOSPITAL_COMMUNITY)
Admission: RE | Disposition: A | Discharge status: Home / Self Care | Payer: Self-pay | Source: Ambulatory Visit | Attending: Cardiovascular Disease

## 2022-02-23 ENCOUNTER — Telehealth: Payer: Self-pay | Admitting: Cardiovascular Disease

## 2022-02-23 ENCOUNTER — Other Ambulatory Visit: Payer: Self-pay | Admitting: *Deleted

## 2022-02-23 ENCOUNTER — Other Ambulatory Visit: Payer: Self-pay

## 2022-02-23 ENCOUNTER — Ambulatory Visit (HOSPITAL_COMMUNITY)
Admission: RE | Admit: 2022-02-23 | Discharge: 2022-02-24 | Disposition: A | Discharge status: Home / Self Care | Payer: Medicare Other | Source: Ambulatory Visit | Attending: Cardiovascular Disease | Admitting: Cardiovascular Disease

## 2022-02-23 DIAGNOSIS — I739 Peripheral vascular disease, unspecified: Secondary | ICD-10-CM | POA: Insufficient documentation

## 2022-02-23 DIAGNOSIS — I10 Essential (primary) hypertension: Secondary | ICD-10-CM | POA: Diagnosis not present

## 2022-02-23 DIAGNOSIS — Z79899 Other long term (current) drug therapy: Secondary | ICD-10-CM | POA: Insufficient documentation

## 2022-02-23 DIAGNOSIS — E785 Hyperlipidemia, unspecified: Secondary | ICD-10-CM | POA: Diagnosis not present

## 2022-02-23 DIAGNOSIS — I70212 Atherosclerosis of native arteries of extremities with intermittent claudication, left leg: Secondary | ICD-10-CM | POA: Diagnosis not present

## 2022-02-23 DIAGNOSIS — Z87891 Personal history of nicotine dependence: Secondary | ICD-10-CM | POA: Insufficient documentation

## 2022-02-23 HISTORY — PX: ABDOMINAL AORTOGRAM W/LOWER EXTREMITY: CATH118223

## 2022-02-23 LAB — POCT ACTIVATED CLOTTING TIME
Activated Clotting Time: 293 seconds
Activated Clotting Time: 251 seconds
Activated Clotting Time: 167 seconds

## 2022-02-23 LAB — BASIC METABOLIC PANEL
Anion gap: 8 (ref 5–15)
BUN: 19 mg/dL (ref 8–23)
CO2: 23 mmol/L (ref 22–32)
Calcium: 9.7 mg/dL (ref 8.9–10.3)
Chloride: 104 mmol/L (ref 98–111)
Creatinine, Ser: 1.2 mg/dL (ref 0.61–1.24)
GFR, Estimated: >60 mL/min (ref >60)
Glucose, Bld: 130 mg/dL — ABNORMAL HIGH (ref 70–99)
Potassium: 4 mmol/L (ref 3.5–5.1)
Sodium: 135 mmol/L (ref 135–145)

## 2022-02-23 SURGERY — ABDOMINAL AORTOGRAM W/LOWER EXTREMITY
Anesthesia: LOCAL

## 2022-02-23 MED ORDER — SODIUM CHLORIDE 0.9 % IV SOLN: 250.0000 mL | INTRAVENOUS | Status: DC | PRN

## 2022-02-23 MED ORDER — LIDOCAINE HCL (PF) 1 % IJ SOLN
INTRAMUSCULAR | Status: DC | PRN
  Administered 2022-02-23: 25 mL

## 2022-02-23 MED ORDER — HYDRALAZINE HCL 20 MG/ML IJ SOLN: 5.0000 mg | INTRAMUSCULAR | Status: DC | PRN

## 2022-02-23 MED ORDER — ATORVASTATIN CALCIUM 80 MG PO TABS: 80.0000 mg | ORAL_TABLET | Freq: Every day | ORAL | Status: DC

## 2022-02-23 MED ORDER — SODIUM CHLORIDE 0.9 % IV SOLN: INTRAVENOUS | Status: AC

## 2022-02-23 MED ORDER — ATORVASTATIN CALCIUM 80 MG PO TABS
80.0000 mg | ORAL_TABLET | Freq: Every day | ORAL | Status: DC
Start: 2022-02-23 — End: 2022-02-24
  Administered 2022-02-23 – 2022-02-24 (×2): 80 mg via ORAL
  Filled 2022-02-23 (×2): qty 1

## 2022-02-23 MED ORDER — MORPHINE SULFATE (PF) 2 MG/ML IV SOLN: 2.0000 mg | INTRAVENOUS | Status: DC | PRN

## 2022-02-23 MED ORDER — HEPARIN (PORCINE) IN NACL 1000-0.9 UT/500ML-% IV SOLN
INTRAVENOUS | Status: AC
  Filled 2022-02-23: qty 1000

## 2022-02-23 MED ORDER — ONDANSETRON HCL 4 MG/2ML IJ SOLN: 4.0000 mg | Freq: Four times a day (QID) | INTRAMUSCULAR | Status: DC | PRN

## 2022-02-23 MED ORDER — ATORVASTATIN CALCIUM 40 MG PO TABS
40.0000 mg | ORAL_TABLET | Freq: Every day | ORAL | Status: DC
  Filled 2022-02-23: qty 1

## 2022-02-23 MED ORDER — HEPARIN SODIUM (PORCINE) 1000 UNIT/ML IJ SOLN
INTRAMUSCULAR | Status: AC
Start: 2022-02-23 — End: ?
  Filled 2022-02-23: qty 10

## 2022-02-23 MED ORDER — MIDAZOLAM HCL 2 MG/2ML IJ SOLN
INTRAMUSCULAR | Status: AC
  Filled 2022-02-23: qty 2

## 2022-02-23 MED ORDER — FENTANYL CITRATE (PF) 100 MCG/2ML IJ SOLN
INTRAMUSCULAR | Status: AC
  Filled 2022-02-23: qty 2

## 2022-02-23 MED ORDER — ACETAMINOPHEN 325 MG PO TABS: 650.0000 mg | ORAL_TABLET | ORAL | Status: DC | PRN

## 2022-02-23 MED ORDER — FENTANYL CITRATE (PF) 100 MCG/2ML IJ SOLN
INTRAMUSCULAR | Status: DC | PRN
  Administered 2022-02-23: 25 ug via INTRAVENOUS

## 2022-02-23 MED ORDER — MORPHINE SULFATE (PF) 2 MG/ML IV SOLN
INTRAVENOUS | Status: AC
  Filled 2022-02-23: qty 1

## 2022-02-23 MED ORDER — LIDOCAINE HCL (PF) 1 % IJ SOLN
INTRAMUSCULAR | Status: AC
Start: 2022-02-23 — End: ?
  Filled 2022-02-23: qty 30

## 2022-02-23 MED ORDER — ASPIRIN 81 MG PO TBEC
81.0000 mg | DELAYED_RELEASE_TABLET | Freq: Every day | ORAL | Status: DC
  Administered 2022-02-23 – 2022-02-24 (×2): 81 mg via ORAL
  Filled 2022-02-23 (×2): qty 1

## 2022-02-23 MED ORDER — LIDOCAINE HCL (PF) 1 % IJ SOLN
INTRAMUSCULAR | Status: AC
  Filled 2022-02-23: qty 30

## 2022-02-23 MED ORDER — METOPROLOL TARTRATE 5 MG/5ML IV SOLN
5.0000 mg | Freq: Once | INTRAVENOUS | Status: DC
  Filled 2022-02-23: qty 5

## 2022-02-23 MED ORDER — LABETALOL HCL 5 MG/ML IV SOLN: 10.0000 mg | INTRAVENOUS | Status: DC | PRN

## 2022-02-23 MED ORDER — ASPIRIN 81 MG PO TBEC
81.0000 mg | DELAYED_RELEASE_TABLET | Freq: Every day | ORAL | Status: DC
Start: 2022-02-24 — End: 2022-02-23

## 2022-02-23 MED ORDER — METOPROLOL TARTRATE 5 MG/5ML IV SOLN
INTRAVENOUS | Status: AC
  Filled 2022-02-23: qty 5

## 2022-02-23 MED ORDER — MIDAZOLAM HCL 2 MG/2ML IJ SOLN
INTRAMUSCULAR | Status: DC | PRN
  Administered 2022-02-23: 1 mg via INTRAVENOUS

## 2022-02-23 MED ORDER — ASPIRIN 81 MG PO TBEC
81.0000 mg | DELAYED_RELEASE_TABLET | Freq: Every day | ORAL | Status: DC
  Filled 2022-02-23: qty 1

## 2022-02-23 MED ORDER — SODIUM CHLORIDE 0.9% FLUSH: 3.0000 mL | INTRAVENOUS | Status: DC | PRN

## 2022-02-23 MED ORDER — ASPIRIN 81 MG PO CHEW: 81.0000 mg | CHEWABLE_TABLET | ORAL | Status: DC

## 2022-02-23 MED ORDER — SODIUM CHLORIDE 0.9 % WEIGHT BASED INFUSION: 1.0000 mL/kg/h | INTRAVENOUS | Status: DC

## 2022-02-23 MED ORDER — SODIUM CHLORIDE 0.9% FLUSH
3.0000 mL | Freq: Two times a day (BID) | INTRAVENOUS | Status: DC
  Administered 2022-02-24: 3 mL via INTRAVENOUS

## 2022-02-23 MED ORDER — AMLODIPINE BESYLATE 5 MG PO TABS
7.5000 mg | ORAL_TABLET | Freq: Every day | ORAL | Status: DC
  Administered 2022-02-24: 7.5 mg via ORAL
  Filled 2022-02-23: qty 2

## 2022-02-23 MED ORDER — HEPARIN (PORCINE) IN NACL 1000-0.9 UT/500ML-% IV SOLN
INTRAVENOUS | Status: DC | PRN
  Administered 2022-02-23 (×2): 500 mL

## 2022-02-23 MED ORDER — SODIUM CHLORIDE 0.9% FLUSH
3.0000 mL | Freq: Two times a day (BID) | INTRAVENOUS | Status: DC
Start: 2022-02-23 — End: 2022-02-24
  Administered 2022-02-23 – 2022-02-24 (×2): 3 mL via INTRAVENOUS

## 2022-02-23 MED ORDER — IODIXANOL 320 MG/ML IV SOLN
INTRAVENOUS | Status: DC | PRN
  Administered 2022-02-23: 140 mL

## 2022-02-23 MED ORDER — HEPARIN SODIUM (PORCINE) 1000 UNIT/ML IJ SOLN
INTRAMUSCULAR | Status: DC | PRN
  Administered 2022-02-23: 10000 [IU] via INTRAVENOUS

## 2022-02-23 MED ORDER — SODIUM CHLORIDE 0.9 % WEIGHT BASED INFUSION
3.0000 mL/kg/h | INTRAVENOUS | Status: DC
  Administered 2022-02-23: 3 mL/kg/h via INTRAVENOUS

## 2022-02-23 MED ORDER — SODIUM CHLORIDE 0.9% FLUSH
3.0000 mL | INTRAVENOUS | Status: DC | PRN
Start: 2022-02-23 — End: 2022-02-24

## 2022-02-23 SURGICAL SUPPLY — 21 items
CATH ANGIO 5F PIGTAIL 65CM (CATHETERS) ×1 IMPLANT
CATH CROSS OVER TEMPO 5F (CATHETERS) ×1 IMPLANT
CATH QUICKCROSS .035X135CM (MICROCATHETER) ×1 IMPLANT
CATH VIANCE CROSS STAND 150CM (MICROCATHETER) ×2
CATH VIANCE CROSS STD 150CM (MICROCATHETER) IMPLANT
DEVICE TORQUE .025-.038 (MISCELLANEOUS) ×1 IMPLANT
GLIDEWIRE ANGLED SS 035X260CM (WIRE) ×1 IMPLANT
KIT PV (KITS) ×2 IMPLANT
SHEATH HIGHFLEX ANSEL 7FR 55CM (SHEATH) ×1 IMPLANT
SHEATH PINNACLE 5F 10CM (SHEATH) ×1 IMPLANT
SHEATH PINNACLE 7F 10CM (SHEATH) ×1 IMPLANT
SHEATH PROBE COVER 6X72 (BAG) ×1 IMPLANT
STOPCOCK MORSE 400PSI 3WAY (MISCELLANEOUS) ×1 IMPLANT
SYR MEDRAD MARK 7 150ML (SYRINGE) ×2 IMPLANT
TAPE SHOOT N SEE (TAPE) ×1 IMPLANT
TRANSDUCER W/STOPCOCK (MISCELLANEOUS) ×2 IMPLANT
TRAY PV CATH (CUSTOM PROCEDURE TRAY) ×2 IMPLANT
TUBING CIL FLEX 10 FLL-RA (TUBING) ×1 IMPLANT
WIRE HITORQ VERSACORE ST 145CM (WIRE) ×2 IMPLANT
WIRE ROSEN-J .035X180CM (WIRE) ×1 IMPLANT
WIRE SHEPHERD 6G .014 (WIRE) ×1 IMPLANT

## 2022-02-23 NOTE — Progress Notes (Signed)
Site area: Right groin a 6 french arterial sheath was removed  Site Prior to Removal:  Level 0  Pressure Applied For 20 MINUTES    Bedrest Beginning at 1540pm X 4 hours  Manual:   Yes.    Patient Status During Pull:  stable  Post Pull Groin Site:  Level 0  Post Pull Instructions Given:  Yes.    Post Pull Pulses Present:  Yes.    Dressing Applied:  Yes.    Comments:

## 2022-02-23 NOTE — Telephone Encounter (Signed)
Lori aware Dr Gwenlyn Found will plan on keeping patient overnight tonight after the procedure today.

## 2022-02-23 NOTE — Progress Notes (Signed)
Pts tachycardia is now SR 84bpm// freq PVC's some are couplets, Called PA VIN cards/ will hold metoprolol 53mIV till HR 100 or >

## 2022-02-23 NOTE — Interval H&P Note (Signed)
History and Physical Interval Note:  02/23/2022 11:31 AM  Terry Morales  has presented today for surgery, with the diagnosis of pad.  The various methods of treatment have been discussed with the patient and family. After consideration of risks, benefits and other options for treatment, the patient has consented to  Procedure(s): ABDOMINAL AORTOGRAM W/LOWER EXTREMITY (N/A) as a surgical intervention.  The patient's history has been reviewed, patient examined, no change in status, stable for surgery.  I have reviewed the patient's chart and labs.  Questions were answered to the patient's satisfaction.     Quay Burow

## 2022-02-23 NOTE — Progress Notes (Signed)
Patient arrived to 4E from Cath lab. Vitals taken and stable. Tele placed and CCMD notified. Patient oriented to staff and unit. Patient on bedrest until 1940. Patient verbally acknowledged bedrest. Call bell within reach. Family at the bedside.  Terry Morales

## 2022-02-23 NOTE — Telephone Encounter (Addendum)
*  Patient's daughter,Lori, states because patient has mobility issues, and she would be unable to assist him if he has problems at home, she is concerned about patient going home after the procedure. Per Dr Anson Crofts plan to keep overnight.

## 2022-02-23 NOTE — Telephone Encounter (Signed)
Daughter calling to confirm rather or not patient will spend the night at the hospital. Please advis

## 2022-02-23 NOTE — Progress Notes (Signed)
Patient arrived for his PV procedure today.  NT notified me that HR was high, place patient on bedside heart monitor, ST 120's,  Bp 124/92, O2 sats are 97% RA.  Patient denies any discomfort, chest pain.  Asked him if he felt like his heart was racing, he states no.  Patient did take Claritin D this am.  EKG done shows accelerated junctional rhythm.  Notified Vin/PA, he will come see the patient

## 2022-02-23 NOTE — Progress Notes (Signed)
Patient noted in junctional tachycardia with PVCs. He is asymptomatic. He is taking Claritin D for past few days for congestion.  Stress test yesterday low risk with EF around 50%. Having PVCs. His K was 5.3 on 7/14.   Will recheck BMP. Give IV metoprolol '5mg'$  x1. Proceed with PV case.   Likely will need outpatient monitor and echo.

## 2022-02-24 ENCOUNTER — Ambulatory Visit: Payer: Medicare Other

## 2022-02-24 ENCOUNTER — Encounter (HOSPITAL_COMMUNITY): Payer: Self-pay | Admitting: Cardiovascular Disease

## 2022-02-24 ENCOUNTER — Other Ambulatory Visit: Payer: Self-pay | Admitting: Physician Assistant

## 2022-02-24 DIAGNOSIS — Z79899 Other long term (current) drug therapy: Secondary | ICD-10-CM | POA: Diagnosis not present

## 2022-02-24 DIAGNOSIS — I739 Peripheral vascular disease, unspecified: Secondary | ICD-10-CM | POA: Diagnosis not present

## 2022-02-24 DIAGNOSIS — I471 Supraventricular tachycardia: Secondary | ICD-10-CM

## 2022-02-24 DIAGNOSIS — Z87891 Personal history of nicotine dependence: Secondary | ICD-10-CM | POA: Diagnosis not present

## 2022-02-24 DIAGNOSIS — I493 Ventricular premature depolarization: Secondary | ICD-10-CM

## 2022-02-24 DIAGNOSIS — I498 Other specified cardiac arrhythmias: Secondary | ICD-10-CM

## 2022-02-24 DIAGNOSIS — E785 Hyperlipidemia, unspecified: Secondary | ICD-10-CM | POA: Diagnosis not present

## 2022-02-24 DIAGNOSIS — I10 Essential (primary) hypertension: Secondary | ICD-10-CM | POA: Diagnosis not present

## 2022-02-24 DIAGNOSIS — I4719 Other supraventricular tachycardia: Secondary | ICD-10-CM

## 2022-02-24 LAB — CBC
HCT: 36.3 % — ABNORMAL LOW (ref 39.0–52.0)
Hemoglobin: 12.6 g/dL — ABNORMAL LOW (ref 13.0–17.0)
MCH: 32.8 pg (ref 26.0–34.0)
MCHC: 34.7 g/dL (ref 30.0–36.0)
MCV: 94.5 fL (ref 80.0–100.0)
Platelets: 256 10*3/uL (ref 150–400)
RBC: 3.84 MIL/uL — ABNORMAL LOW (ref 4.22–5.81)
RDW: 12.4 % (ref 11.5–15.5)
WBC: 7.4 10*3/uL (ref 4.0–10.5)
nRBC: 0 % (ref 0.0–0.2)

## 2022-02-24 LAB — BASIC METABOLIC PANEL
Anion gap: 7 (ref 5–15)
BUN: 16 mg/dL (ref 8–23)
CO2: 22 mmol/L (ref 22–32)
Calcium: 8.7 mg/dL — ABNORMAL LOW (ref 8.9–10.3)
Chloride: 105 mmol/L (ref 98–111)
Creatinine, Ser: 1.3 mg/dL — ABNORMAL HIGH (ref 0.61–1.24)
GFR, Estimated: 57 mL/min — ABNORMAL LOW (ref >60)
Glucose, Bld: 163 mg/dL — ABNORMAL HIGH (ref 70–99)
Potassium: 4.4 mmol/L (ref 3.5–5.1)
Sodium: 134 mmol/L — ABNORMAL LOW (ref 135–145)

## 2022-02-24 LAB — LIPID PANEL
Cholesterol: 196 mg/dL (ref 0–200)
HDL: 42 mg/dL (ref >40)
LDL Cholesterol: 113 mg/dL — ABNORMAL HIGH (ref 0–99)
Total CHOL/HDL Ratio: 4.7 RATIO
Triglycerides: 206 mg/dL — ABNORMAL HIGH (ref <150)
VLDL: 41 mg/dL — ABNORMAL HIGH (ref 0–40)

## 2022-02-24 MED ORDER — ATORVASTATIN CALCIUM 80 MG PO TABS: 80.0000 mg | ORAL_TABLET | Freq: Every day | ORAL | 6 refills | Status: DC

## 2022-02-24 NOTE — Progress Notes (Signed)
Mobility Specialist: Progress Note   02/24/22 1036  Mobility  Activity Ambulated independently in hallway  Level of Assistance Independent  Assistive Device None  Distance Ambulated (ft) 340 ft  Activity Response Tolerated well  $Mobility charge 1 Mobility   Post-Mobility: 83 HR, 174/65 (91) BP, 98% SpO2  Received pt sitting EOB having no complaints and agreeable to mobility. Pt was asymptomatic throughout ambulation and returned to room w/o fault. Left sitting EOB w/ call bell in reach and all needs met.  [facility] Esias Mory Mobility Specialist Mobility Specialist 4 [address]: [phone]

## 2022-02-24 NOTE — Progress Notes (Unsigned)
ZIO XT 14 day - mailed to pt's home address 

## 2022-02-24 NOTE — Discharge Summary (Addendum)
Discharge Summary    Patient ID: Terry Morales MRN: [id number]; DOB: 03-May-1946  Admit date: 02/23/2022 Discharge date: 02/24/2022  PCP:  Martinique, Betty G, MD   [place] Prov[facility] Cardiologist:  Quay Burow, MD   {   Discharge Diagnoses    Principal Problem:   Claudication in peripheral vascular disease ([place])   PVCs  Junctional tachycardia   Diagnostic Studies/Procedures    Procedures Performed:             1.  Ultrasound-guided right common femoral access             2.  Abdominal aortogram/bilateral iliac angiogram/bifemoral runoff.             3.  Contralateral access (second-order catheter placement)             4.  Failed attempt at left SFA CTO intervention   PROCEDURE DESCRIPTION:    The patient was brought to the second floor [place] [facility]iac cath lab in the the postabsorptive state. He was premedicated with IV Versed and fentanyl. His right groin was prepped and shaved in usual sterile fashion. Xylocaine 1% was used for local anesthesia. A 5 French sheath was inserted into the right common femoral artery using standard Seldinger technique.  A 5 French pigtail catheter was placed in the distal abdominal aorta.  Distal abdominal aortography, bilateral iliac angiography with bifemoral runoff was performed using bolus chase, digital subtraction and step table technique.  Omnipaque dye was used for the entirety of the case (140 cc of contrast total to patient).  Retrograde ordered pressures monitored during the case.     Angiographic Data:    1: Abdominal aorta-renal arteries widely patent.  The infrarenal abdominal aorta is free of significant atherosclerotic changes. 2: Left lower extremity-there is a little long segment calcified CTO mid left SFA with reconstitution in the adductor canal.  There is one-vessel runoff via the posterior tibial artery. 3: Right lower extremity-70% segmental calcified mid right SFA stenosis with one-vessel runoff via the peroneal    IMPRESSION: Terry Morales has bilateral SFA disease left greater than right without long calcified CTO on the left and one-vessel runoff bilaterally.  We will attempt endovascular therapy of his left SFA CTO.   Procedure Description: Contralateral access was obtained with an 035 Rosen wire and a crossover catheter.  I placed a 7 French 55 cm multipurpose Ansell sheath in the left common femoral artery.  The patient received 10,000 units of heparin with an ACT of 293.  He was already on aspirin.  Omnipaque dye was used for the entirety of the intervention.  I attempted to cross the long calcified CTO with a Viance CTO catheter along with 6 g Shepperd wire unsuccessfully.  I changed to a quick cross catheter and a 035 stiff angled Glidewire again unsuccessfully.  I abandon the procedure.  The Ansell sheath was then withdrawn across the bifurcation and exchanged over an 035 wire for a short 7 [place] sheath which was then secured in place.  The ending ACT was 251.   Final Impression: Unsuccessful attempt at left SFA CTO intervention for claudication because of length and calcification.  Given the patient's age, size of sheath and the fact that he was heparinized I am going to keep him overnight.  He will be hydrated, discharged home the morning and I will see him back in 2 weeks for follow-up.  He left the lab in stable condition.     History of Present Illness  Terry Morales is a 76 y.o. male with history of hypertension, hyperlipidemia, peripheral artery disease and prior tobacco abuse presented for outpatient PV angiogram.  Patient with left lower extremity claudication greater than right for past year. Doppler studies performed 01/11/2022 revealing a right ABI of 0.81 with a moderate lesion in the mid right SFA and a left ABI of 0.56 with an occluded distal left SFA.  Peripheral angiogram arranged.   CT cardiac scoring 02/20/22: Coronary calcium score of 1397. This was 72 percentile for age-, race-, and  sex-matched controls.  Stress test 02/22/22 : low risk study with normal perfusion, mild LV dysfunction (EF50%) with frequent PVCs.  Hospital Course     Consultants:   PAD -Unsuccessful attempt at left SFA CTO intervention for claudication because of length and calcification.  Plan to follow up with Dr. Gwenlyn Found in clinic.  -Continue ASA and Lipitor '80mg'$  qd  2. Junctional tachycardia 3. PVCs/ventricular bigeminy -Patient was noted in junctional tachycardia at rate of 130s with PVCs and ventricular bigeminy in short stay prior to PVC angiogram.  Plan was to give IV metoprolol but heart rate came down spontaneously.  He was taking Claritin-D at home for past few days and advised to avoid.   - Overnight patient with breif intermittent tachycardia but continued frequent PVCs.   -Potassium 4.4. -Recent outpatient stress test was low risk with EF around 50%. -? outpatient echocardiogram to rule out structural abnormality and  monitor.  - ? Add BB -Avoid stimulants like Sudafed  3. HLD -02/24/2022: Cholesterol 196; HDL 42; LDL Cholesterol 113; Triglycerides 206; VLDL 41  - Lipitor increased to '80mg'$  qd  - Lipid panel and LFTS in 8 weeks   4. HTN - BP stable on Amlodipine   Did the patient have an acute coronary syndrome (MI, NSTEMI, STEMI, etc) this admission?:  No                               Did the patient have a percutaneous coronary intervention (stent / angioplasty)?:  No.    Discharge Vitals Blood pressure 119/61, pulse 66, temperature 98.2 F (36.8 C), temperature source Oral, resp. rate 20, height 6' (1.829 m), weight 90.7 kg, SpO2 97 %.  Filed Weights   02/23/22 1001  Weight: 90.7 kg   Physical Exam Constitutional:      Appearance: Normal appearance.  HENT:     Head: Normocephalic.     Nose: Nose normal.  Eyes:     Extraocular Movements: Extraocular movements intact.     Pupils: Pupils are equal, round, and reactive to light.  Cardiovascular:     Rate and Rhythm: Normal  rate and regular rhythm.     Pulses: Normal pulses.     Heart sounds: Normal heart sounds.     Comments: Groin site without hematoma  Pulmonary:     Effort: Pulmonary effort is normal.     Breath sounds: Normal breath sounds.  Abdominal:     General: Abdomen is flat. Bowel sounds are normal.  Musculoskeletal:        General: Normal range of motion.     Cervical back: Normal range of motion.  Skin:    General: Skin is warm.  Neurological:     General: No focal deficit present.     Mental Status: He is alert and oriented to person, place, and time.  Psychiatric:        Mood and Affect:  Mood normal.        Behavior: Behavior normal.     Labs & Radiologic Studies    CBC Recent Labs    02/24/22 0323  WBC 7.4  HGB 12.6*  HCT 36.3*  MCV 94.5  PLT 818   Basic Metabolic Panel Recent Labs    02/23/22 1035 02/24/22 0323  NA 135 134*  K 4.0 4.4  CL 104 105  CO2 23 22  GLUCOSE 130* 163*  BUN 19 16  CREATININE 1.20 1.30*  CALCIUM 9.7 8.7*    Fasting Lipid Panel Recent Labs    02/24/22 0323  CHOL 196  HDL 42  LDLCALC 113*  TRIG 206*  CHOLHDL 4.7   _____________  PERIPHERAL VASCULAR CATHETERIZATION  Result Date: 02/23/2022 Images from the original result were not included.  [id number] LOCATION:  FACILITY: [facility] PHYSICIAN: Quay Burow, M.D. 27-Jan-1946 DATE OF PROCEDURE:  02/23/2022 DATE OF DISCHARGE: PV Angiogram/Intervention History obtained from chart review.Terry Morales is a 76 y.o. mildly overweight widowed Caucasian male father of 2, grandfather of 3 grandchildren who is accompanied by his daughter [name] today.  He was referred by Dr. Betty Martinique, his PCP, for evaluation and treatment of PAD.  He is a retired Furniture conservator/restorer.  His cardiovascular risk factor profile is notable for 55 years of tobacco abuse having quit 5 years ago, treated hypertension hyperlipidemia.  There is no family history for heart disease.  Is never had a heart attack or stroke.  He denies chest pain or  shortness of breath.  He has had lower extremity claudication left greater than right for over a year with recent Doppler studies performed 01/11/2022 revealing a right ABI of 0.81 with a moderate lesion in the mid right SFA and a left ABI of 0.56 with an occluded distal left SFA. Pre Procedure Diagnosis: Peripheral arterial disease Post Procedure Diagnosis: Peripheral arterial disease Operators: Dr. Quay Burow Procedures Performed:  1.  Ultrasound-guided right common femoral access  2.  Abdominal aortogram/bilateral iliac angiogram/bifemoral runoff.  3.  Contralateral access (second-order catheter placement)  4.  Failed attempt at left SFA CTO intervention PROCEDURE DESCRIPTION: The patient was brought to the second floor [facility] cath lab in the the postabsorptive state. He was premedicated with IV Versed and fentanyl. His right groin was prepped and shaved in usual sterile fashion. Xylocaine 1% was used for local anesthesia. A 5 French sheath was inserted into the right common femoral artery using standard Seldinger technique.  A 5 French pigtail catheter was placed in the distal abdominal aorta.  Distal abdominal aortography, bilateral iliac angiography with bifemoral runoff was performed using bolus chase, digital subtraction and step table technique.  Omnipaque dye was used for the entirety of the case (140 cc of contrast total to patient).  Retrograde ordered pressures monitored during the case.  Angiographic Data: 1: Abdominal aorta-renal arteries widely patent.  The infrarenal abdominal aorta is free of significant atherosclerotic changes. 2: Left lower extremity-there is a little long segment calcified CTO mid left SFA with reconstitution in the adductor canal.  There is one-vessel runoff via the posterior tibial artery. 3: Right lower extremity-70% segmental calcified mid right SFA stenosis with one-vessel runoff via the peroneal   Terry Morales has bilateral SFA disease left greater than right  without long calcified CTO on the left and one-vessel runoff bilaterally.  We will attempt endovascular therapy of his left SFA CTO. Procedure Description: Contralateral access was obtained with an 035 Rosen wire and a crossover catheter.  I placed a 7 French 55 cm multipurpose Ansell sheath in the left common femoral artery.  The patient received 10,000 units of heparin with an ACT of 293.  He was already on aspirin.  Omnipaque dye was used for the entirety of the intervention.  I attempted to cross the long calcified CTO with a Viance CTO catheter along with 6 g Shepperd wire unsuccessfully.  I changed to a quick cross catheter and a 035 stiff angled Glidewire again unsuccessfully.  I abandon the procedure.  The Ansell sheath was then withdrawn across the bifurcation and exchanged over an 035 wire for a short 7 [place] sheath which was then secured in place.  The ending ACT was 251. Final Impression: Unsuccessful attempt at left SFA CTO intervention for claudication because of length and calcification.  Given the patient's age, size of sheath and the fact that he was heparinized I am going to keep him overnight.  He will be hydrated, discharged home the morning and I will see him back in 2 weeks for follow-up.  He left the lab in stable condition. Quay Burow. MD, [facility] 02/23/2022 1:00 PM    MYOCARDIAL PERFUSION IMAGING  Result Date: 02/22/2022   The study is normal. The study is low risk.   horizontal <25m ST depression (V4, V5 and V6) was noted during stress   LV perfusion is normal. There is no evidence of ischemia. There is no evidence of infarction.   Left ventricular function is abnormal. Global function is mildly reduced. Nuclear stress EF: 50 %. The left ventricular ejection fraction is mildly decreased (45-54%). End diastolic cavity size is normal. End systolic cavity size is normal.   Prior study not available for comparison. Nonspecific <168mST depressions on stress EKG Mild systolic dysfunction (EF  50%), though had frequent PVCs that may have affected gating.  Consider echocardiogram to better evaluate systolic function Normal perfusion Low risk study   CT CARDIAC SCORING (SELF PAY ONLY)  Addendum Date: 02/21/2022   ADDENDUM REPORT: 02/21/2022 08:12 EXAM: OVER-READ INTERPRETATION  CT CHEST The following report is an over-read performed by radiologist Dr. [facility] on 02/21/2022. This over-read does not include interpretation of cardiac or coronary anatomy or pathology. The coronary calcium score interpretation by the cardiologist is attached. COMPARISON:  None. FINDINGS: Atherosclerotic calcifications in the thoracic aorta. Within the visualized portions of the thorax there are no suspicious appearing pulmonary nodules or masses, there is no acute consolidative airspace disease, no pleural effusions, no pneumothorax and no lymphadenopathy. Visualized portions of the upper abdomen are unremarkable. There are no aggressive appearing lytic or blastic lesions noted in the visualized portions of the skeleton. IMPRESSION: 1.  Aortic Atherosclerosis (ICD10-I70.0). Electronically Signed   By: DaVinnie Langton.D.   On: 02/21/2022 08:12   Result Date: 02/21/2022 CLINICAL DATA:  Cardiovascular Disease Risk stratification EXAM: Coronary Calcium Score TECHNIQUE: A gated, non-contrast computed tomography scan of the heart was performed using 56m456mlice thickness. Axial images were analyzed on a dedicated workstation. Calcium scoring of the coronary arteries was performed using the Agatston method. FINDINGS: Coronary arteries: Normal origins. Coronary Calcium Score: Left main: 0 Left anterior descending artery: 757 Left circumflex artery: 70.6 Right coronary artery: 570 Total: 1397 Percentile: 84 Pericardium: Normal. Ascending Aorta: Normal caliber. Non-cardiac: [facility]. IMPRESSION: Coronary calcium score of 1397. This was 84 11rcentile for age-,  race-, and sex-matched controls. RECOMMENDATIONS: Coronary artery calcium (CAC) score is a strong predictor of  incident coronary heart disease (CHD) and provides predictive information beyond traditional risk factors. CAC scoring is reasonable to use in the decision to withhold, postpone, or initiate statin therapy in intermediate-risk or selected borderline-risk asymptomatic adults (age 57-75 years and LDL-C >=70 to <190 mg/dL) who do not have diabetes or established atherosclerotic cardiovascular disease (ASCVD).* In intermediate-risk (10-year ASCVD risk >=7.5% to <20%) adults or selected borderline-risk (10-year ASCVD risk >=5% to <7.5%) adults in whom a CAC score is measured for the purpose of making a treatment decision the following recommendations have been made: If CAC=0, it is reasonable to withhold statin therapy and reassess in 5 to 10 years, as long as higher risk conditions are absent (diabetes mellitus, family history of premature CHD in first degree relatives (males <55 years; females <65 years), cigarette smoking, or LDL >=190 mg/dL). If CAC is 1 to 99, it is reasonable to initiate statin therapy for patients >=94 years of age. If CAC is >=100 or >=75th percentile, it is reasonable to initiate statin therapy at any age. Cardiology referral should be considered for patients with CAC scores >=400 or >=75th percentile. *2018 AHA/ACC/AACVPR/AAPA/ABC/ACPM/ADA/AGS/APhA/ASPC/NLA/PCNA Guideline on the Management of Blood Cholesterol: A Report of the American College of Cardiology/American Heart Association Task Force on Clinical Practice Guidelines. J Am Coll Cardiol. 2019;73(24):3168-3209. Berniece Salines, DO The noncardiac portion of this study will be interpreted in separate report by the radiologist. Electronically Signed: By: Berniece Salines D.O. On: 02/20/2022 17:42    Disposition   Pt is being discharged home today in good condition.  Follow-up Plans & Appointments     Follow-up Information      Lorretta Harp, MD Follow up on 03/08/2022.   Specialties: Cardiology, Radiology Why: '@10'$ :30am Contact information: [address] [place] [address] [phone]                Discharge Instructions     Diet - low sodium heart healthy   Complete by: As directed    Discharge instructions   Complete by: As directed    No driving for 48 hours. No lifting over 5 lbs for 1 week. No sexual activity for 1 week. Keep procedure site clean & dry. If you notice increased pain, swelling, bleeding or pus, call/return!  You may shower, but no soaking baths/hot tubs/pools for 1 week.   Increase activity slowly   Complete by: As directed        Discharge Medications   Allergies as of 02/24/2022   No Known Allergies      Medication List     TAKE these medications    acetaminophen 500 MG tablet Commonly known as: TYLENOL Take 1,000 mg by mouth every 6 (six) hours as needed for moderate pain.   amLODipine 5 MG tablet Commonly known as: NORVASC Take 1.5 tablets (7.5 mg total) by mouth daily.   aspirin EC 81 MG tablet Take 1 tablet (81 mg total) by mouth daily. Swallow whole.   atorvastatin 80 MG tablet Commonly known as: LIPITOR Take 1 tablet (80 mg total) by mouth daily. What changed:  medication strength how much to take   loratadine-pseudoephedrine 10-240 MG 24 hr tablet Commonly known as: CLARITIN-D 24-hour Take 1 tablet by mouth daily as needed for allergies.   Mens Multivitamin Tabs Take 1 tablet by mouth daily.   OSTEO BI-FLEX ONE PER DAY PO Take 1 tablet by mouth daily.   silodosin 8 MG Caps capsule Commonly known as: RAPAFLO Take 8 mg by  mouth daily with breakfast.           Outstanding Labs/Studies   Monitor and echo  - Lipid panel and LFTS in 8 weeks   Duration of Discharge Encounter   Greater than 30 minutes including physician time.  SignedLeanor Kail, PA 02/24/2022, 10:47 AM  ATTENDING ATTESTATION:  After  conducting a review of all available clinical information with the care team, interviewing the patient, and performing a physical exam, I agree with the findings and plan described in this note.   GEN: No acute distress.   HEENT:  MMM, no JVD, no scleral icterus Cardiac: RRR, no murmurs, rubs, or gallops.  Respiratory: Clear to auscultation bilaterally. GI: Soft, nontender, non-distended  MS: No edema; No deformity. Neuro:  Nonfocal  Vasc:  +2 radial pulses; RFA site stable with mild bruising  Patient is doing well after failed left SFA intervention.  He developed a junctional tachycardia postprocedure and he was asymptomatic.  I had a discussion with the patient and his daughter about treatment options including empiric beta-blockade versus a monitor.  He would like to hold off on beta-blocker for now.  Discharge today with close cardiology follow-up with Dr. Gwenlyn Found.   Lenna Sciara, MD Pager [phone]

## 2022-02-24 NOTE — Progress Notes (Signed)
Patient discharging home with support from daughter. Ivs removed without complications. Tele removed and CCMD notified. Discharge instructions given and medication administration discussed. All questions answered. Martinique C Gurkaran Rahm

## 2022-02-27 ENCOUNTER — Encounter (HOSPITAL_COMMUNITY): Payer: Medicare Other

## 2022-02-27 MED FILL — Heparin Sod (Porcine)-NaCl IV Soln 1000 Unit/500ML-0.9%: INTRAVENOUS | Qty: 500 | Status: AC

## 2022-03-07 ENCOUNTER — Encounter (HOSPITAL_COMMUNITY): Payer: Medicare Other

## 2022-03-08 ENCOUNTER — Encounter: Payer: Self-pay | Admitting: Cardiovascular Disease

## 2022-03-08 ENCOUNTER — Ambulatory Visit: Payer: Medicare Other | Admitting: Cardiovascular Disease

## 2022-03-08 DIAGNOSIS — E785 Hyperlipidemia, unspecified: Secondary | ICD-10-CM

## 2022-03-08 DIAGNOSIS — I10 Essential (primary) hypertension: Secondary | ICD-10-CM | POA: Diagnosis not present

## 2022-03-08 DIAGNOSIS — I739 Peripheral vascular disease, unspecified: Secondary | ICD-10-CM | POA: Diagnosis not present

## 2022-03-08 DIAGNOSIS — R931 Abnormal findings on diagnostic imaging of heart and coronary circulation: Secondary | ICD-10-CM | POA: Diagnosis not present

## 2022-03-08 MED ORDER — CILOSTAZOL 50 MG PO TABS: 50.0000 mg | ORAL_TABLET | Freq: Two times a day (BID) | ORAL | 1 refills | Status: DC

## 2022-03-08 NOTE — Patient Instructions (Addendum)
Medication Instructions:   -Start cilostazol (pletal) '50mg'$  twice daily.  *If you need a refill on your cardiac medications before your next appointment, please call your pharmacy*   Lab Work: Your physician recommends that you return for lab work in: 3 months for Lipid/liver panel  If you have labs (blood work) drawn today and your tests are completely normal, you will receive your results only by: [place] (if you have MyChart) OR A paper copy in the mail If you have any lab test that is abnormal or we need to change your treatment, we will call you to review the results.   Follow-Up: At [facility], you and your health needs are our priority.  As part of our continuing mission to provide you with exceptional heart care, we have created designated Provider Care Teams.  These Care Teams include your primary Cardiologist (physician) and Advanced Practice Providers (APPs -  Physician Assistants and Nurse Practitioners) who all work together to provide you with the care you need, when you need it.  We recommend signing up for the patient portal called "MyChart".  Sign up information is provided on this After Visit Summary.  MyChart is used to connect with patients for Virtual Visits (Telemedicine).  Patients are able to view lab/test results, encounter notes, upcoming appointments, etc.  Non-urgent messages can be sent to your provider as well.   To learn more about what you can do with MyChart, go to [email or web address].    Your next appointment:   3 month(s)  The format for your next appointment:   In Person  Provider:   Quay Burow, MD

## 2022-03-08 NOTE — Assessment & Plan Note (Signed)
History of PAD with left lower extremity claudication greater than right status post failed attempt at left SFA intervention of a long calcified CTO by myself 02/23/2022.  He had two-vessel runoff bilaterally with 70% segmental mid right SFA stenosis.  He was kept overnight.  Does have moderate ecchymosis in his lower abdomen and is lateral right thigh.  I am going to begin him on Pletal 50 mg p.o. twice daily to see if he has a therapeutic benefit.

## 2022-03-08 NOTE — Assessment & Plan Note (Signed)
History of essential hypertension blood pressure measured today 130/48.  He is on amlodipine.

## 2022-03-08 NOTE — Assessment & Plan Note (Signed)
History of hyperlipidemia currently on high-dose statin therapy with lipid profile performed 02/24/2022  revealing total cholesterol 196, LDL 113 and HDL of 42.  We will recheck a lipid liver profile in 3 months.

## 2022-03-08 NOTE — Assessment & Plan Note (Signed)
Coronary calcium score performed 02/21/2022 was 1397 with calcium mostly distributed in the LAD and RCA.  He is completely asymptomatic however, a Myoview stress test performed 02/22/2022 revealed low normal LV function of 50% without a perfusion abnormality.

## 2022-03-08 NOTE — Progress Notes (Signed)
03/08/2022 Terry Morales   [birth year]  [id number]  Primary Physician Martinique, Betty G, MD Primary Cardiologist: Lorretta Harp MD [name], [place]  HPI:  Terry Morales is a 76 y.o.   mildly overweight widowed Caucasian male father of 2, grandfather of 3 grandchildren who is accompanied by his daughter Terry Morales today.  He was referred by Dr. Betty Martinique, his PCP, for evaluation and treatment of PAD.  He is a retired Furniture conservator/restorer.  I last saw him in the office 02/03/2022.  His cardiovascular risk factor profile is notable for 55 years of tobacco abuse having quit 5 years ago, treated hypertension hyperlipidemia.  There is no family history for heart disease.  Is never had a heart attack or stroke.  He denies chest pain or shortness of breath.  He has had lower extremity claudication left greater than right for over a year with recent Doppler studies performed 01/11/2022 revealing a right ABI of 0.81 with a moderate lesion in the mid right SFA and a left ABI of 0.56 with an occluded distal left SFA.  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  I was unsuccessful at crossing his CTO and the procedure was aborted.  He is discharged home the following day.  He did have some junctional tachycardia and frequent PVCs on telemetry for which a Zio patch was ordered which however he is never put this on and he is totally unaware of these.  I did do a coronary calcium score on him 02/21/2022 which was 1397 which led to a Lexiscan the following day that was complete normal with an EF of 50%.   Current Meds  Medication Sig   acetaminophen (TYLENOL) 500 MG tablet Take 1,000 mg by mouth every 6 (six) hours as needed for moderate pain.   amLODipine (NORVASC) 5 MG tablet Take 1.5 tablets (7.5 mg total) by mouth daily.   aspirin 81 MG EC tablet Take 1 tablet (81 mg total) by mouth  daily. Swallow whole.   atorvastatin (LIPITOR) 80 MG tablet Take 1 tablet (80 mg total) by mouth daily.   Boswellia-Glucosamine-Vit D (OSTEO BI-FLEX ONE PER DAY PO) Take 1 tablet by mouth daily.   cilostazol (PLETAL) 50 MG tablet Take 1 tablet (50 mg total) by mouth 2 (two) times daily.   Multiple Vitamins-Minerals (MENS MULTIVITAMIN) TABS Take 1 tablet by mouth daily.   silodosin (RAPAFLO) 8 MG CAPS capsule Take 8 mg by mouth daily with breakfast.   Current Facility-Administered Medications for the 03/08/22 encounter (Office Visit) with Lorretta Harp, MD  Medication   sodium chloride flush (NS) 0.9 % injection 3 mL     No Known Allergies  Social History   Socioeconomic History   Marital status: Widowed    Spouse name: Not on file   Number of children: 2   Years of education: Not on file   Highest education level: Not on file  Occupational History   Occupation: Furniture conservator/restorer    Comment: retired  Tobacco Use   Smoking status: Former    Packs/day: 1.00    Years: 60.00    Total pack years: 60.00    Types: Cigarettes    Start date: 07/25/1959    Quit date: 10/07/2015    Years since quitting: 6.4   Smokeless tobacco: Never  Vaping Use   Vaping Use: Never used  Substance  and Sexual Activity   Alcohol use: Yes    Comment: occassionally   Drug use: Not Currently   Sexual activity: Not Currently  Other Topics Concern   Not on file  Social History Narrative   Not on file   Social Determinants of Health   Financial Resource Strain: Low Risk  (01/06/2022)   Overall Financial Resource Strain (CARDIA)    Difficulty of Paying Living Expenses: Not hard at all  Food Insecurity: No Food Insecurity (01/06/2022)   Hunger Vital Sign    Worried About Running Out of Food in the Last Year: Never true    Ran Out of Food in the Last Year: Never true  Transportation Needs: No Transportation Needs (01/06/2022)   PRAPARE - Hydrologist (Medical): No    Lack of  Transportation (Non-Medical): No  Physical Activity: Sufficiently Active (01/06/2022)   Exercise Vital Sign    Days of Exercise per Week: 7 days    Minutes of Exercise per Session: 150+ min  Stress: No Stress Concern Present (01/06/2022)   [place]    Feeling of Stress : Not at all  Social Connections: Socially Isolated (01/06/2022)   Social Connection and Isolation Panel [NHANES]    Frequency of Communication with Friends and Family: More than three times a week    Frequency of Social Gatherings with Friends and Family: More than three times a week    Attends Religious Services: Never    Marine scientist or Organizations: No    Attends Archivist Meetings: Never    Marital Status: Widowed  Intimate Partner Violence: Not At Risk (01/06/2022)   Humiliation, Afraid, Rape, and Kick questionnaire    Fear of Current or Ex-Partner: No    Emotionally Abused: No    Physically Abused: No    Sexually Abused: No     Review of Systems: General: negative for chills, fever, night sweats or weight changes.  Cardiovascular: negative for chest pain, dyspnea on exertion, edema, orthopnea, palpitations, paroxysmal nocturnal dyspnea or shortness of breath Dermatological: negative for rash Respiratory: negative for cough or wheezing Urologic: negative for hematuria Abdominal: negative for nausea, vomiting, diarrhea, bright red blood per rectum, melena, or hematemesis Neurologic: negative for visual changes, syncope, or dizziness All other systems reviewed and are otherwise negative except as noted above.    Blood pressure (!) 130/48, pulse 78, height 6' (1.829 m), weight 202 lb 6.4 oz (91.8 kg), SpO2 94 %.  General appearance: alert and no distress Neck: no adenopathy, no carotid bruit, no JVD, supple, symmetrical, trachea midline, and thyroid not enlarged, symmetric, no tenderness/mass/nodules Lungs: clear to  auscultation bilaterally Heart: regular rate and rhythm, S1, S2 normal, no murmur, click, rub or gallop Extremities: extremities normal, atraumatic, no cyanosis or edema Pulses: Diminished pedal pulses Skin: Ecchymosis right groin Neurologic: Grossly normal  EKG not performed today  ASSESSMENT AND PLAN:   Hypertension, essential, benign History of essential hypertension blood pressure measured today 130/48.  He is on amlodipine.  PAD (peripheral artery disease) (HCC) History of PAD with left lower extremity claudication greater than right status post failed attempt at left SFA intervention of a long calcified CTO by myself 02/23/2022.  He had two-vessel runoff bilaterally with 70% segmental mid right SFA stenosis.  He was kept overnight.  Does have moderate ecchymosis in his lower abdomen and is lateral right thigh.  I am going to begin him on  Pletal 50 mg p.o. twice daily to see if he has a therapeutic benefit.  Hyperlipidemia LDL goal <70 History of hyperlipidemia currently on high-dose statin therapy with lipid profile performed 02/24/2022  revealing total cholesterol 196, LDL 113 and HDL of 42.  We will recheck a lipid liver profile in 3 months.  Elevated coronary artery calcium score Coronary calcium score performed 02/21/2022 was 1397 with calcium mostly distributed in the LAD and RCA.  He is completely asymptomatic however, a Myoview stress test performed 02/22/2022 revealed low normal LV function of 50% without a perfusion abnormality.     Lorretta Harp MD FACP,FACC,FAHA, [facility] 03/08/2022 10:51 AM

## 2022-03-09 ENCOUNTER — Encounter (HOSPITAL_COMMUNITY): Payer: Medicare Other

## 2022-05-29 NOTE — Progress Notes (Unsigned)
HPI: Terry Morales is a 76 y.o. male, who is here today with her daughter for follow-up. He was last seen on 12/06/2021.  Since his last visit he has followed with Dr. Gwenlyn Found for his PAD. Hospitalized in 02/2022 to perform ***, unsuccessful. He states that he was prescribed Pletal 50 mg, which he takes twice daily as per Dr. Kennon Holter instructions. He mentions that his pain level has significantly decreased from an eight to a three since starting the medication.  The patient is currently taking atorvastatin 80 mg daily for cholesterol and amlodipine 5 mg daily for blood pressure. He does not check his blood pressures at home due to concerns about inaccurate readings.  Lab Results  Component Value Date   CHOL 196 02/24/2022   HDL 42 02/24/2022   LDLCALC 113 (H) 02/24/2022   TRIG 206 (H) 02/24/2022   CHOLHDL 4.7 02/24/2022   He denies experiencing any chest pain, difficulty breathing, or palpitations.  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 He plans to discuss this further with the volunteer program before making a decision.  The patient reports experiencing claudication, with pain decreasing when walking. He has been advised not to push himself too hard during physical activity. He has also made dietary changes to improve his cholesterol levels, avoiding high-cholesterol foods such as bacon and sausage.  The patient has a history of mildly abnormal kidney function and is scheduled for labs on November 30th. He has an umbilical hernia, which he is considering having surgically repaired after discussing with Dr. Gwenlyn Found and waiting at least six months due to his recent cardiovascular event.  Lab Results  Component Value Date   CREATININE 1.30 (H) 02/24/2022   BUN 16 02/24/2022   NA 134 (L) 02/24/2022   K 4.4 02/24/2022   CL 105 02/24/2022   CO2 22  02/24/2022    Review of Systems  Constitutional:  Negative for activity change, appetite change and fever.  HENT:  Negative for nosebleeds, sore throat and trouble swallowing.   Respiratory:  Negative for cough and wheezing.   Gastrointestinal:  Negative for abdominal pain, nausea and vomiting.  Genitourinary:  Negative for decreased urine volume, dysuria and hematuria.  Neurological:  Negative for syncope, facial asymmetry and weakness.  Rest see pertinent positives and negatives per HPI.  Current Outpatient Medications on File Prior to Visit  Medication Sig Dispense Refill   acetaminophen (TYLENOL) 500 MG tablet Take 1,000 mg by mouth every 6 (six) hours as needed for moderate pain.     amLODipine (NORVASC) 5 MG tablet Take 1.5 tablets (7.5 mg total) by mouth daily. 135 tablet 2   aspirin 81 MG EC tablet Take 1 tablet (81 mg total) by mouth daily. Swallow whole. 30 tablet 12   atorvastatin (LIPITOR) 80 MG tablet Take 1 tablet (80 mg total) by mouth daily. 30 tablet 6   Boswellia-Glucosamine-Vit D (OSTEO BI-FLEX ONE PER DAY PO) Take 1 tablet by mouth daily.     cilostazol (PLETAL) 50 MG tablet Take 1 tablet (50 mg total) by mouth 2 (two) times daily. 180 tablet 1   loratadine-pseudoephedrine (CLARITIN-D 24-HOUR) 10-240 MG 24 hr tablet Take 1 tablet by mouth daily as needed for allergies.     Multiple Vitamins-Minerals (MENS MULTIVITAMIN) TABS Take 1 tablet by mouth daily.     silodosin (RAPAFLO) 8 MG CAPS capsule Take 8  mg by mouth daily with breakfast.     No current facility-administered medications on file prior to visit.    Past Medical History:  Diagnosis Date   Hypertension    Prostate cancer ([place])    No Known Allergies  Social History   Socioeconomic History   Marital status: Widowed    Spouse name: Not on file   Number of children: 2   Years of education: Not on file   Highest education level: Not on file  Occupational History   Occupation: Furniture conservator/restorer    Comment:  retired  Tobacco Use   Smoking status: Former    Packs/day: 1.00    Years: 60.00    Total pack years: 60.00    Types: Cigarettes    Start date: 07/25/1959    Quit date: 10/07/2015    Years since quitting: 6.6   Smokeless tobacco: Never  Vaping Use   Vaping Use: Never used  Substance and Sexual Activity   Alcohol use: Yes    Comment: occassionally   Drug use: Not Currently   Sexual activity: Not Currently  Other Topics Concern   Not on file  Social History Narrative   Not on file   Social Determinants of Health   Financial Resource Strain: Low Risk  (01/06/2022)   Overall Financial Resource Strain (CARDIA)    Difficulty of Paying Living Expenses: Not hard at all  Food Insecurity: No Food Insecurity (01/06/2022)   Hunger Vital Sign    Worried About Running Out of Food in the Last Year: Never true    Terry Morales in the Last Year: Never true  Transportation Needs: No Transportation Needs (01/06/2022)   PRAPARE - Hydrologist (Medical): No    Lack of Transportation (Non-Medical): No  Physical Activity: Sufficiently Active (01/06/2022)   Exercise Vital Sign    Days of Exercise per Week: 7 days    Minutes of Exercise per Session: 150+ min  Stress: No Stress Concern Present (01/06/2022)   [place]    Feeling of Stress : Not at all  Social Connections: Socially Isolated (01/06/2022)   Social Connection and Isolation Panel [NHANES]    Frequency of Communication with Friends and Family: More than three times a week    Frequency of Social Gatherings with Friends and Family: More than three times a week    Attends Religious Services: Never    Marine scientist or Organizations: No    Attends Archivist Meetings: Never    Marital Status: Widowed    Vitals:   05/30/22 1057  BP: 118/64  Pulse: 82  Resp: 16  SpO2: 97%   Body mass index is 27.7 kg/m.  Physical  Exam Vitals and nursing note reviewed.  Constitutional:      General: He is not in acute distress.    Appearance: He is well-developed.  HENT:     Head: Normocephalic and atraumatic.  Eyes:     Conjunctiva/sclera: Conjunctivae normal.  Cardiovascular:     Rate and Rhythm: Normal rate and regular rhythm.     Heart sounds: No murmur (? *** RUSB) heard.    Comments: DP pulses palpable. Good capillary refill. Pulmonary:     Effort: Pulmonary effort is normal. No respiratory distress.     Breath sounds: Normal breath sounds.  Abdominal:     Palpations: Abdomen is soft. There is no hepatomegaly or mass.  Tenderness: There is no abdominal tenderness.     Hernia: A hernia is present. Hernia is present in the umbilical area.  Skin:    General: Skin is warm.     Findings: No erythema or rash.  Neurological:     Mental Status: He is alert and oriented to person, place, and time.     Cranial Nerves: No cranial nerve deficit.     Gait: Gait normal.  Psychiatric:     Comments: Well groomed, good eye contact.     ASSESSMENT AND PLAN:   Terry Morales was seen today for follow-up.  Diagnoses and all orders for this visit:  PAD (peripheral artery disease) ([place])  Hypertension, essential, benign -     Basic metabolic panel; Future -     Basic metabolic panel  Hyperlipidemia LDL goal <82  Umbilical hernia without obstruction and without gangrene    Orders Placed This Encounter  Procedures   Basic metabolic panel    Hypertension, essential, benign BP adequately controlled. Continue amlodipine 5 mg 1.5 tablet daily and low-salt diet. Continue monitoring BP regularly.  PAD (peripheral artery disease) ([place]) Still having some claudication but pain has improved greatly after starting Pletal 50 mg twice daily. Also on atorvastatin 80 mg daily. Continue appropriate foot care. Following with vascular.  Hyperlipidemia LDL goal <70 LDL is not at goal, last LDL 113 in 02/2022. Pending  fasting lipid panel later this month. Continue atorvastatin 80 mg daily and low-fat diet.  Return in about 28 weeks (around 12/12/2022) for cpe.   Terry Bossi G. Martinique, MD  [facility]. [place] office.

## 2022-05-30 ENCOUNTER — Encounter: Payer: Self-pay | Admitting: Family Medicine

## 2022-05-30 ENCOUNTER — Ambulatory Visit (INDEPENDENT_AMBULATORY_CARE_PROVIDER_SITE_OTHER): Payer: Medicare Other | Admitting: Family Medicine

## 2022-05-30 VITALS — BP 118/64 | HR 82 | Resp 16 | Ht 72.0 in | Wt 204.2 lb

## 2022-05-30 DIAGNOSIS — E785 Hyperlipidemia, unspecified: Secondary | ICD-10-CM | POA: Diagnosis not present

## 2022-05-30 DIAGNOSIS — I739 Peripheral vascular disease, unspecified: Secondary | ICD-10-CM

## 2022-05-30 DIAGNOSIS — K429 Umbilical hernia without obstruction or gangrene: Secondary | ICD-10-CM

## 2022-05-30 DIAGNOSIS — I10 Essential (primary) hypertension: Secondary | ICD-10-CM | POA: Diagnosis not present

## 2022-05-30 NOTE — Assessment & Plan Note (Signed)
LDL is not at goal, last LDL 113 in 02/2022. Pending fasting lipid panel later this month. Continue atorvastatin 80 mg daily and low-fat diet.

## 2022-05-30 NOTE — Assessment & Plan Note (Signed)
Still having some claudication but pain has improved greatly after starting Pletal 50 mg twice daily. Also on atorvastatin 80 mg daily. Continue appropriate foot care. Following with vascular.

## 2022-05-30 NOTE — Assessment & Plan Note (Signed)
BP adequately controlled. Continue amlodipine 5 mg 1.5 tablet daily and low-salt diet. Continue monitoring BP regularly.

## 2022-05-30 NOTE — Patient Instructions (Signed)
A few things to remember from today's visit:   PAD (peripheral artery disease) ([place])  Hypertension, essential, benign - Plan: Basic metabolic panel  Hyperlipidemia LDL goal <25  Umbilical hernia without obstruction and without gangrene  No changes today. We can plan on umbilical hernia repair in 6 months, before of cleared by cardiologist. Umbilical Hernia, Adult  A hernia is a bulge of tissue that pushes through an opening between muscles. An umbilical hernia happens in the abdomen, near the belly button (umbilicus). The hernia may contain tissues from the small intestine, large intestine, or fatty tissue covering the intestines. Umbilical hernias in adults tend to get worse over time, and they require surgical treatment. There are different types of umbilical hernias, including: Indirect hernia. This type is located just above or below the umbilicus. It is the most common type of umbilical hernia in adults. Direct hernia. This type forms through an opening formed by the umbilicus. Reducible hernia. This type of hernia comes and goes. It may be visible only when you strain, lift something heavy, or cough. This type of hernia can be pushed back into the abdomen (reduced). Incarcerated hernia. This type traps abdominal tissue inside the hernia. This type of hernia cannot be reduced. Strangulated hernia. This type of hernia cuts off blood flow to the tissues inside the hernia. The tissues can start to die if this happens. This type of hernia requires emergency treatment. What are the causes? An umbilical hernia happens when tissue inside the abdomen presses on a weak area of the abdominal muscles. What increases the risk? You may have a greater risk of this condition if you: Are obese. Have had several pregnancies. Have a buildup of fluid inside your abdomen. Have had surgery that weakens the abdominal muscles. What are the signs or symptoms? The main symptom of this condition is a  painless bulge at or near the belly button. A reducible hernia may be visible only when you strain, lift something heavy, or cough. Other symptoms may include: Dull pain. A feeling of pressure. Symptoms of a strangulated hernia may include: Pain that gets increasingly worse. Nausea and vomiting. Pain when pressing on the hernia. Skin over the hernia becoming red or purple. Constipation. Blood in the stool. How is this diagnosed? This condition may be diagnosed based on: A physical exam. You may be asked to cough or strain while standing. These actions increase the pressure inside your abdomen and can force the hernia through the opening in your muscles. Your health care provider may try to reduce the hernia by pressing on it. Your symptoms and medical history. How is this treated? Surgery is the only treatment for an umbilical hernia. Surgery for a strangulated hernia is done as soon as possible. If you have a small hernia that is not incarcerated, you may need to lose weight before having surgery. Follow these instructions at home: Lose weight, if told by your health care provider. Do not try to push the hernia back in. Watch your hernia for any changes in color or size. Tell your health care provider if any changes occur. You may need to avoid activities that increase pressure on your hernia. Do not lift anything that is heavier than 10 lb (4.5 kg), or the limit that you are told, until your health care provider says that it is safe. Take over-the-counter and prescription medicines only as told by your health care provider. Keep all follow-up visits. This is important. Contact a health care provider if: Your  hernia gets larger. Your hernia becomes painful. Get help right away if: You develop sudden, severe pain near the area of your hernia. You have pain as well as nausea or vomiting. You have pain and the skin over your hernia changes color. You develop a fever or  chills. Summary A hernia is a bulge of tissue that pushes through an opening between muscles. An umbilical hernia happens near the belly button. Surgery is the only treatment for an umbilical hernia. Do not try to push your hernia back in. Keep all follow-up visits. This is important. This information is not intended to replace advice given to you by your health care provider. Make sure you discuss any questions you have with your health care provider. Document Revised: 02/16/2020 Document Reviewed: 02/16/2020 Elsevier Patient Education  [employer].  If you need refills for medications you take chronically, please call your pharmacy. Do not use My Chart to request refills or for acute issues that need immediate attention. If you send a my chart message, it may take a few days to be addressed, specially if I am not in the office.  Please be sure medication list is accurate. If a new problem present, please set up appointment sooner than planned today.

## 2022-06-02 NOTE — Addendum Note (Signed)
Addended by: Rosalyn Gess D on: 06/02/2022 04:42 PM   Modules accepted: Orders

## 2022-06-22 DIAGNOSIS — I739 Peripheral vascular disease, unspecified: Secondary | ICD-10-CM | POA: Diagnosis not present

## 2022-06-22 DIAGNOSIS — R931 Abnormal findings on diagnostic imaging of heart and coronary circulation: Secondary | ICD-10-CM | POA: Diagnosis not present

## 2022-06-22 DIAGNOSIS — I10 Essential (primary) hypertension: Secondary | ICD-10-CM | POA: Diagnosis not present

## 2022-06-22 DIAGNOSIS — E785 Hyperlipidemia, unspecified: Secondary | ICD-10-CM | POA: Diagnosis not present

## 2022-06-22 LAB — HEPATIC FUNCTION PANEL
ALT: 13 IU/L (ref 0–44)
AST: 11 IU/L (ref 0–40)
Albumin: 4.8 g/dL (ref 3.8–4.8)
Alkaline Phosphatase: 93 IU/L (ref 44–121)
Bilirubin Total: 0.7 mg/dL (ref 0.0–1.2)
Bilirubin, Direct: 0.26 mg/dL (ref 0.00–0.40)
Total Protein: 7 g/dL (ref 6.0–8.5)

## 2022-06-22 LAB — LIPID PANEL
Chol/HDL Ratio: 2 ratio (ref 0.0–5.0)
Cholesterol, Total: 177 mg/dL (ref 100–199)
HDL: 90 mg/dL (ref >39)
LDL Chol Calc (NIH): 71 mg/dL (ref 0–99)
Triglycerides: 89 mg/dL (ref 0–149)
VLDL Cholesterol Cal: 16 mg/dL (ref 5–40)

## 2022-06-28 ENCOUNTER — Ambulatory Visit: Payer: Medicare Other | Attending: Cardiovascular Disease | Admitting: Cardiovascular Disease

## 2022-06-28 ENCOUNTER — Encounter: Payer: Self-pay | Admitting: Cardiovascular Disease

## 2022-06-28 VITALS — BP 138/60 | HR 77 | Ht 72.0 in | Wt 205.8 lb

## 2022-06-28 DIAGNOSIS — E785 Hyperlipidemia, unspecified: Secondary | ICD-10-CM | POA: Diagnosis not present

## 2022-06-28 DIAGNOSIS — I10 Essential (primary) hypertension: Secondary | ICD-10-CM | POA: Diagnosis not present

## 2022-06-28 DIAGNOSIS — R931 Abnormal findings on diagnostic imaging of heart and coronary circulation: Secondary | ICD-10-CM

## 2022-06-28 DIAGNOSIS — I739 Peripheral vascular disease, unspecified: Secondary | ICD-10-CM | POA: Diagnosis not present

## 2022-06-28 NOTE — Progress Notes (Signed)
06/28/2022 Dion Body   05/18/1946  [id number]  Primary Physician Morales, Terry G, MD Primary Cardiologist: Lorretta Harp MD Terry Morales, [place]  HPI:  Terry Morales is a 76 y.o.    mildly overweight widowed Caucasian male father of 2, grandfather of 3 grandchildren who is accompanied by his daughter Terry Morales today.  He was referred by Dr. Betty Morales, his PCP, for evaluation and treatment of PAD.  He is a retired Furniture conservator/restorer.  I last saw him in the office 03/08/2022.  His cardiovascular risk factor profile is notable for 55 years of tobacco abuse having quit 5 years ago, treated hypertension hyperlipidemia.  There is no family history for heart disease.  Is never had a heart attack or stroke.  He denies chest pain or shortness of breath.  He has had lower extremity claudication left greater than right for over a year with recent Doppler studies performed 01/11/2022 revealing a right ABI of 0.81 with a moderate lesion in the mid right SFA and a left ABI of 0.56 with an occluded distal left SFA.   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  I was unsuccessful at crossing his CTO and the procedure was aborted.  He is discharged home the following day.  He did have some junctional tachycardia and frequent PVCs on telemetry for which a Zio patch was ordered which however he is never put this on and he is totally unaware of these.  I did do a coronary calcium score on him 02/21/2022 which was 1397 which led to a Lexiscan the following day that was complete normal with an EF of 50%.  Since I saw him 4 months ago he continues to do well.  His claudication has markedly improved with Pletal.  His LDL has improved on statin therapy.  He denies chest pain or shortness of breath.  He apparently needs elective umbilical hernia repair which I cleared him for at low  risk.   Current Meds  Medication Sig   acetaminophen (TYLENOL) 500 MG tablet Take 1,000 mg by mouth every 6 (six) hours as needed for moderate pain.   amLODipine (NORVASC) 5 MG tablet Take 1.5 tablets (7.5 mg total) by mouth daily.   atorvastatin (LIPITOR) 80 MG tablet Take 1 tablet (80 mg total) by mouth daily.   Boswellia-Glucosamine-Vit D (OSTEO BI-FLEX ONE PER DAY PO) Take 1 tablet by mouth daily.   cilostazol (PLETAL) 50 MG tablet Take 1 tablet (50 mg total) by mouth 2 (two) times daily.   loratadine-pseudoephedrine (CLARITIN-D 24-HOUR) 10-240 MG 24 hr tablet Take 1 tablet by mouth daily as needed for allergies.   Multiple Vitamins-Minerals (MENS MULTIVITAMIN) TABS Take 1 tablet by mouth daily.   silodosin (RAPAFLO) 8 MG CAPS capsule Take 8 mg by mouth daily with breakfast.     No Known Allergies  Social History   Socioeconomic History   Marital status: Widowed    Spouse name: Not on file   Number of children: 2   Years of education: Not on file   Highest education level: Not on file  Occupational History   Occupation: Furniture conservator/restorer    Comment: retired  Tobacco Use   Smoking status: Former    Packs/day: 1.00    Years: 60.00    Total pack years: 60.00    Types: Cigarettes    Start date: 07/25/1959  Quit date: 10/07/2015    Years since quitting: 6.7   Smokeless tobacco: Never  Vaping Use   Vaping Use: Never used  Substance and Sexual Activity   Alcohol use: Yes    Comment: occassionally   Drug use: Not Currently   Sexual activity: Not Currently  Other Topics Concern   Not on file  Social History Narrative   Not on file   Social Determinants of Health   Financial Resource Strain: Low Risk  (01/06/2022)   Overall Financial Resource Strain (CARDIA)    Difficulty of Paying Living Expenses: Not hard at all  Food Insecurity: No Food Insecurity (01/06/2022)   Hunger Vital Sign    Worried About Running Out of Food in the Last Year: Never true    Ran Out of Food in the  Last Year: Never true  Transportation Needs: No Transportation Needs (01/06/2022)   PRAPARE - Hydrologist (Medical): No    Lack of Transportation (Non-Medical): No  Physical Activity: Sufficiently Active (01/06/2022)   Exercise Vital Sign    Days of Exercise per Week: 7 days    Minutes of Exercise per Session: 150+ min  Stress: No Stress Concern Present (01/06/2022)   [place]    Feeling of Stress : Not at all  Social Connections: Socially Isolated (01/06/2022)   Social Connection and Isolation Panel [NHANES]    Frequency of Communication with Friends and Family: More than three times a week    Frequency of Social Gatherings with Friends and Family: More than three times a week    Attends Religious Services: Never    Marine scientist or Organizations: No    Attends Archivist Meetings: Never    Marital Status: Widowed  Intimate Partner Violence: Not At Risk (01/06/2022)   Humiliation, Afraid, Rape, and Kick questionnaire    Fear of Current or Ex-Partner: No    Emotionally Abused: No    Physically Abused: No    Sexually Abused: No     Review of Systems: General: negative for chills, fever, night sweats or weight changes.  Cardiovascular: negative for chest pain, dyspnea on exertion, edema, orthopnea, palpitations, paroxysmal nocturnal dyspnea or shortness of breath Dermatological: negative for rash Respiratory: negative for cough or wheezing Urologic: negative for hematuria Abdominal: negative for nausea, vomiting, diarrhea, bright red blood per rectum, melena, or hematemesis Neurologic: negative for visual changes, syncope, or dizziness All other systems reviewed and are otherwise negative except as noted above.    Blood pressure 138/60, pulse 77, height 6' (1.829 m), weight 205 lb 12.8 oz (93.4 kg), SpO2 94 %.  General appearance: alert and no distress Neck: no  adenopathy, no JVD, supple, symmetrical, trachea midline, thyroid not enlarged, symmetric, no tenderness/mass/nodules, and soft left carotid bruit Lungs: clear to auscultation bilaterally Heart: regular rate and rhythm, S1, S2 normal, no murmur, click, rub or gallop Extremities: extremities normal, atraumatic, no cyanosis or edema Pulses: Diminished pedal pulses Skin: Skin color, texture, turgor normal. No rashes or lesions Neurologic: Grossly normal  EKG not performed today  ASSESSMENT AND PLAN:   Hypertension, essential, benign History of essential hypertension with blood pressure measured today at 138/60.  He is on amlodipine.  PAD (peripheral artery disease) (HCC) History of peripheral arterial disease with a known left SFA CTO and moderate right SFA stenosis with one-vessel runoff bilaterally.  He had unsuccessful attempt at left SFA intervention percutaneously because of failure  to cross the lesion on 02/23/2022.  However, his claudication is markedly improved on Pletal not to the point where he has minimal lifestyle-limiting claudication.  Hyperlipidemia LDL goal <70 History of hyperlipidemia on statin therapy with LDL that improved from 113 on 02/24/2022 down to 71 on 06/22/2022.  Elevated coronary artery calcium score Elevated coronary calcium score of 1397 mostly in the LAD and RCA territories with a subsequent negative Myoview stress test 02/22/2022.  Patient is asymptomatic.     Lorretta Harp MD FACP,FACC,FAHA, [facility] 06/28/2022 1:47 PM

## 2022-06-28 NOTE — Patient Instructions (Addendum)
Medication Instructions:  Your physician has recommended you make the following change in your medication:   -Start aspirin '81mg'$  daily.   *If you need a refill on your cardiac medications before your next appointment, please call your pharmacy*   Testing/Procedures: Your physician has requested that you have a carotid duplex. This test is an ultrasound of the carotid arteries in your neck. It looks at blood flow through these arteries that supply the brain with blood. Allow one hour for this exam. There are no restrictions or special instructions. This will take place at [place], [address].   Follow-Up: At [facility], you and your health needs are our priority.  As part of our continuing mission to provide you with exceptional heart care, we have created designated Provider Care Teams.  These Care Teams include your primary Cardiologist (physician) and Advanced Practice Providers (APPs -  Physician Assistants and Nurse Practitioners) who all work together to provide you with the care you need, when you need it.  We recommend signing up for the patient portal called "MyChart".  Sign up information is provided on this After Visit Summary.  MyChart is used to connect with patients for Virtual Visits (Telemedicine).  Patients are able to view lab/test results, encounter notes, upcoming appointments, etc.  Non-urgent messages can be sent to your provider as well.   To learn more about what you can do with MyChart, go to [email or web address].    Your next appointment:   12 month(s)  The format for your next appointment:   In Person  Provider:   Quay Burow, MD      Other Instructions Per Dr. Gwenlyn Found, cleared at low risk from a cardiac standpoint for surgery. Please hold pletal 2 days prior to procedure.

## 2022-06-28 NOTE — Assessment & Plan Note (Signed)
History of essential hypertension with blood pressure measured today at 138/60.  He is on amlodipine.

## 2022-06-28 NOTE — Assessment & Plan Note (Signed)
History of peripheral arterial disease with a known left SFA CTO and moderate right SFA stenosis with one-vessel runoff bilaterally.  He had unsuccessful attempt at left SFA intervention percutaneously because of failure to cross the lesion on 02/23/2022.  However, his claudication is markedly improved on Pletal not to the point where he has minimal lifestyle-limiting claudication.

## 2022-06-28 NOTE — Assessment & Plan Note (Signed)
History of hyperlipidemia on statin therapy with LDL that improved from 113 on 02/24/2022 down to 71 on 06/22/2022.

## 2022-06-28 NOTE — Assessment & Plan Note (Signed)
Elevated coronary calcium score of 1397 mostly in the LAD and RCA territories with a subsequent negative Myoview stress test 02/22/2022.  Patient is asymptomatic.

## 2022-07-06 ENCOUNTER — Ambulatory Visit (HOSPITAL_COMMUNITY)
Admission: RE | Admit: 2022-07-06 | Discharge: 2022-07-06 | Disposition: A | Discharge status: Home / Self Care | Payer: Medicare Other | Source: Ambulatory Visit | Attending: Cardiovascular Disease | Admitting: Cardiovascular Disease

## 2022-07-06 DIAGNOSIS — I10 Essential (primary) hypertension: Secondary | ICD-10-CM | POA: Diagnosis not present

## 2022-07-06 DIAGNOSIS — E785 Hyperlipidemia, unspecified: Secondary | ICD-10-CM | POA: Diagnosis not present

## 2022-07-06 DIAGNOSIS — R931 Abnormal findings on diagnostic imaging of heart and coronary circulation: Secondary | ICD-10-CM

## 2022-07-06 DIAGNOSIS — I739 Peripheral vascular disease, unspecified: Secondary | ICD-10-CM | POA: Diagnosis not present

## 2022-07-06 DIAGNOSIS — R0989 Other specified symptoms and signs involving the circulatory and respiratory systems: Secondary | ICD-10-CM

## 2022-09-07 ENCOUNTER — Other Ambulatory Visit: Payer: Self-pay

## 2022-09-07 MED ORDER — CILOSTAZOL 50 MG PO TABS: 50.0000 mg | ORAL_TABLET | Freq: Two times a day (BID) | ORAL | 1 refills | Status: DC

## 2022-09-11 NOTE — Progress Notes (Signed)
ACUTE VISIT Chief Complaint  Patient presents with   Hernia   Medication Management   Follow-up   HPI: [name] is a 77 y.o. male with past medical history significant for hypertension, PAD,and HLD here today with her daughter requesting referral to surgeon to evaluate umbilical hernia. He was last seen on 05/30/22. He wants to know which medications he may need to stop before surgery.  Since his last visit he has seen his cardiologist, 06/28/2022. PAD on Pletal 50 mg bid and Aspirin 81 mg daily. Mild calves pain with long walks but it has improved. Negative for LE cyanosis or ulcers. He is on Atorvastatin 80 mg daily.  Former smoker. Smoked in average one pack a day for 50-60 years and quit in March 2017.  Lab Results  Component Value Date   CHOL 177 06/22/2022   HDL 90 06/22/2022   LDLCALC 71 06/22/2022   TRIG 89 06/22/2022   CHOLHDL 2.0 123456   Umbilical hernia has been going on for a while, gradually getting worse and it has been "bothering" him, especially during a recent cold when he was coughing "hard", causing the hernia to "pop out" and causing some discomfort.  Denies nausea, vomiting, changes in bowel habits, blood in stool or melena.  HTN: He is currently taking one and a half Amlodipine twice daily for blood pressure management and  Negative for severe/frequent headache, visual changes, chest pain, dyspnea, palpitation,focal weakness, or worsening edema.  He does not monitor his blood pressure at home. Lab Results  Component Value Date   CREATININE 1.30 (H) 02/24/2022   BUN 16 02/24/2022   NA 134 (L) 02/24/2022   K 4.4 02/24/2022   CL 105 02/24/2022   CO2 22 02/24/2022   Hx of prostate cancer., he sees a urologist regularly and reports good numbers during those visits.  Last PSA 0.4 on 02/23/22.  He admits to consuming alcohol, usually two drinks a day, sometimes a little more, but is mindful not to drink excessively.  Review of Systems   Constitutional:  Negative for activity change, appetite change and fever.  HENT:  Negative for nosebleeds, sore throat and trouble swallowing.   Respiratory:  Negative for cough and wheezing.   Genitourinary:  Negative for decreased urine volume and hematuria.  Skin:  Negative for rash.  Neurological:  Negative for syncope and facial asymmetry.  See other pertinent positives and negatives in HPI.  Current Outpatient Medications on File Prior to Visit  Medication Sig Dispense Refill   acetaminophen (TYLENOL) 500 MG tablet Take 1,000 mg by mouth every 6 (six) hours as needed for moderate pain.     amLODipine (NORVASC) 5 MG tablet Take 1.5 tablets (7.5 mg total) by mouth daily. 135 tablet 2   aspirin 81 MG EC tablet Take 1 tablet (81 mg total) by mouth daily. Swallow whole. 30 tablet 12   atorvastatin (LIPITOR) 80 MG tablet Take 1 tablet (80 mg total) by mouth daily. 30 tablet 6   Boswellia-Glucosamine-Vit D (OSTEO BI-FLEX ONE PER DAY PO) Take 1 tablet by mouth daily.     cilostazol (PLETAL) 50 MG tablet Take 1 tablet (50 mg total) by mouth 2 (two) times daily. 180 tablet 1   loratadine-pseudoephedrine (CLARITIN-D 24-HOUR) 10-240 MG 24 hr tablet Take 1 tablet by mouth daily as needed for allergies.     Multiple Vitamins-Minerals (MENS MULTIVITAMIN) TABS Take 1 tablet by mouth daily.     Multiple Vitamins-Minerals (PRESERVISION AREDS 2 PO) Take 1 tablet  by mouth in the morning and at bedtime.     silodosin (RAPAFLO) 8 MG CAPS capsule Take 8 mg by mouth daily with breakfast.     No current facility-administered medications on file prior to visit.    Past Medical History:  Diagnosis Date   Hypertension    Prostate cancer ([place])    No Known Allergies  Social History   Socioeconomic History   Marital status: Widowed    Spouse name: Not on file   Number of children: 2   Years of education: Not on file   Highest education level: Not on file  Occupational History   Occupation: Furniture conservator/restorer     Comment: retired  Tobacco Use   Smoking status: Former    Packs/day: 1.00    Years: 60.00    Total pack years: 60.00    Types: Cigarettes    Start date: 07/25/1959    Quit date: 10/07/2015    Years since quitting: 6.9   Smokeless tobacco: Never  Vaping Use   Vaping Use: Never used  Substance and Sexual Activity   Alcohol use: Yes    Comment: occassionally   Drug use: Not Currently   Sexual activity: Not Currently  Other Topics Concern   Not on file  Social History Narrative   Not on file   Social Determinants of Health   Financial Resource Strain: Low Risk  (01/06/2022)   Overall Financial Resource Strain (CARDIA)    Difficulty of Paying Living Expenses: Not hard at all  Food Insecurity: No Food Insecurity (01/06/2022)   Hunger Vital Sign    Worried About Running Out of Food in the Last Year: Never true    [place] in the Last Year: Never true  Transportation Needs: No Transportation Needs (01/06/2022)   PRAPARE - Hydrologist (Medical): No    Lack of Transportation (Non-Medical): No  Physical Activity: Sufficiently Active (01/06/2022)   Exercise Vital Sign    Days of Exercise per Week: 7 days    Minutes of Exercise per Session: 150+ min  Stress: No Stress Concern Present (01/06/2022)   [place]    Feeling of Stress : Not at all  Social Connections: Socially Isolated (01/06/2022)   Social Connection and Isolation Panel [NHANES]    Frequency of Communication with Friends and Family: More than three times a week    Frequency of Social Gatherings with Friends and Family: More than three times a week    Attends Religious Services: Never    Marine scientist or Organizations: No    Attends Archivist Meetings: Never    Marital Status: Widowed   Vitals:   09/12/22 1210  BP: 120/60  Pulse: 60  Resp: 16  SpO2: 96%   Body mass index is 27.4  kg/m.  Physical Exam Vitals and nursing note reviewed.  Constitutional:      General: He is not in acute distress.    Appearance: He is well-developed.  HENT:     Head: Normocephalic and atraumatic.  Eyes:     Conjunctiva/sclera: Conjunctivae normal.  Cardiovascular:     Rate and Rhythm: Normal rate. Rhythm irregular. Occasional Extrasystoles are present.    Heart sounds: Murmur (soft RUSB) heard.  Pulmonary:     Effort: Pulmonary effort is normal. No respiratory distress.     Breath sounds: Normal breath sounds.  Abdominal:     Palpations: Abdomen is  soft. There is no hepatomegaly or mass.     Tenderness: There is no abdominal tenderness.     Hernia: A hernia is present. Hernia is present in the umbilical area.  Skin:    General: Skin is warm.     Findings: No erythema or rash.  Neurological:     Mental Status: He is alert and oriented to person, place, and time.     Cranial Nerves: No cranial nerve deficit.     Gait: Gait normal.  Psychiatric:        Mood and Affect: Mood and affect normal.   ASSESSMENT AND PLAN:  Mr. Quist was seen for umbilical hernia and follow up.  Umbilical hernia without obstruction and without gangrene Assessment & Plan: He would like to see surgeon to discussed surgeon to discuss surgical options. Instructed to stop Pletal 2-4 days before surgery and Aspirin 7 days before. He is going to need cardiac clearance. Instructed about warning signs. Referral placed.  Orders: -     Ambulatory referral to General Surgery  Hypertension, essential, benign Assessment & Plan: BP adequately controlled. Continue amlodipine 5 mg 1.5 tablet daily and low-salt diet. Continue monitoring BP regularly.   Alcohol dependence, uncomplicated ([place]) Assessment & Plan: Continues drinking alcohol, mainly beer. He understands adverse effects of high alcohol intake.   PAD (peripheral artery disease) (HCC) Assessment & Plan: Problem has been stable. Continue  Atorvastatin 80 mg daily, Pletal 50 mg twice daily, and aspirin 81 mg daily. Continue appropriate foot care. Following with vascular.   Malignant neoplasm of prostate [facility]) Assessment & Plan: PSA 0.4 in 02/2022. Follows with urologist annually.   Lung cancer screening declined by patient Assessment & Plan: He promised to re-evaluate once his other medical problems are more stable and hernia has been repaired.   Return if symptoms worsen or fail to improve, for keep next appointment.  Jahyra Sukup G. [place], MD  [facility]. [place] office.

## 2022-09-12 ENCOUNTER — Encounter: Payer: Self-pay | Admitting: Family Medicine

## 2022-09-12 ENCOUNTER — Ambulatory Visit (INDEPENDENT_AMBULATORY_CARE_PROVIDER_SITE_OTHER): Payer: Medicare Other | Admitting: Family Medicine

## 2022-09-12 VITALS — BP 120/60 | HR 60 | Resp 16 | Ht 72.0 in | Wt 202.0 lb

## 2022-09-12 DIAGNOSIS — I739 Peripheral vascular disease, unspecified: Secondary | ICD-10-CM

## 2022-09-12 DIAGNOSIS — I10 Essential (primary) hypertension: Secondary | ICD-10-CM

## 2022-09-12 DIAGNOSIS — K429 Umbilical hernia without obstruction or gangrene: Secondary | ICD-10-CM | POA: Diagnosis not present

## 2022-09-12 DIAGNOSIS — F1020 Alcohol dependence, uncomplicated: Secondary | ICD-10-CM | POA: Diagnosis not present

## 2022-09-12 DIAGNOSIS — Z5320 Procedure and treatment not carried out because of patient's decision for unspecified reasons: Secondary | ICD-10-CM

## 2022-09-12 DIAGNOSIS — C61 Malignant neoplasm of prostate: Secondary | ICD-10-CM

## 2022-09-12 NOTE — Patient Instructions (Addendum)
A few things to remember from today's visit:  Hypertension, essential, benign  Umbilical hernia without obstruction and without gangrene - Plan: Ambulatory referral to General Surgery  You will need to hold aspirin for 1 week and pletal around 2-4 days before surgery.  If you need refills for medications you take chronically, please call your pharmacy. Do not use My Chart to request refills or for acute issues that need immediate attention. If you send a my chart message, it may take a few days to be addressed, specially if I am not in the office.  Please be sure medication list is accurate. If a new problem present, please set up appointment sooner than planned today.

## 2022-09-16 NOTE — Assessment & Plan Note (Signed)
Problem has been stable. Continue Atorvastatin 80 mg daily, Pletal 50 mg twice daily, and aspirin 81 mg daily. Continue appropriate foot care. Following with vascular.

## 2022-09-16 NOTE — Assessment & Plan Note (Signed)
PSA 0.4 in 02/2022. Follows with urologist annually.

## 2022-09-16 NOTE — Assessment & Plan Note (Signed)
He promised to re-evaluate once his other medical problems are more stable and hernia has been repaired.

## 2022-09-16 NOTE — Assessment & Plan Note (Addendum)
Continues drinking alcohol, mainly beer. He understands adverse effects of high alcohol intake.

## 2022-09-16 NOTE — Assessment & Plan Note (Signed)
BP adequately controlled. Continue amlodipine 5 mg 1.5 tablet daily and low-salt diet. Continue monitoring BP regularly.

## 2022-09-16 NOTE — Assessment & Plan Note (Signed)
He would like to see surgeon to discussed surgeon to discuss surgical options. Instructed to stop Pletal 2-4 days before surgery and Aspirin 7 days before. He is going to need cardiac clearance. Instructed about warning signs. Referral placed.

## 2022-10-04 ENCOUNTER — Other Ambulatory Visit: Payer: Self-pay

## 2022-10-04 DIAGNOSIS — I739 Peripheral vascular disease, unspecified: Secondary | ICD-10-CM

## 2022-10-04 DIAGNOSIS — E785 Hyperlipidemia, unspecified: Secondary | ICD-10-CM

## 2022-10-04 MED ORDER — ATORVASTATIN CALCIUM 80 MG PO TABS: 80.0000 mg | ORAL_TABLET | Freq: Every day | ORAL | 8 refills | Status: DC

## 2022-10-24 ENCOUNTER — Ambulatory Visit: Payer: Self-pay | Admitting: General Surgery

## 2022-10-24 DIAGNOSIS — K429 Umbilical hernia without obstruction or gangrene: Secondary | ICD-10-CM | POA: Diagnosis not present

## 2022-10-24 NOTE — H&P (View-Only) (Signed)
Chief Complaint: Hernia       History of Present Illness: Terry Morales is a 77 y.o. male who is seen today as an office consultation at the request of Dr. Jordan for evaluation of Hernia .         Patient is a 77-year-old male who comes in with a history of PVD, hyperlipidemia, and umbilical hernia.  He states the hernias been there for approximately a year.  He states that it is painful at times.  He is notices pain discomfort when he bumps into something.  He states that he had no previous abdominal surgery.  He had no signs or symptoms of strangulation.         Review of Systems: A complete review of systems was obtained from the patient.  I have reviewed this information and discussed as appropriate with the patient.  See HPI as well for other ROS.   Review of Systems  Constitutional:  Negative for fever.  HENT:  Negative for congestion.   Eyes:  Negative for blurred vision.  Respiratory:  Negative for cough, shortness of breath and wheezing.   Cardiovascular:  Negative for chest pain and palpitations.  Gastrointestinal:  Negative for heartburn.  Genitourinary:  Negative for dysuria.  Musculoskeletal:  Negative for myalgias.  Skin:  Negative for rash.  Neurological:  Negative for dizziness and headaches.  Psychiatric/Behavioral:  Negative for depression and suicidal ideas.   All other systems reviewed and are negative.       Medical History: Past Medical History Past Medical History: Diagnosis Date  Hyperlipidemia        There is no problem list on file for this patient.     Past Surgical History History reviewed. No pertinent surgical history.     Allergies No Known Allergies    Current Outpatient Medications on File Prior to Visit Medication Sig Dispense Refill  acetaminophen (TYLENOL) 500 MG tablet Take by mouth      amLODIPine (NORVASC) 5 MG tablet Take by mouth      aspirin 81 MG EC tablet Take by mouth      atorvastatin (LIPITOR) 80 MG  tablet Take by mouth      cilostazoL (PLETAL) 50 MG tablet Take 1 tablet by mouth 2 (two) times daily      glucosamine/chondr su A sod (OSTEO BI-FLEX ORAL) Take by mouth      multivitamin (MULTIPLE VITAMINS ORAL) Take by mouth      silodosin (RAPAFLO) 8 mg capsule Take by mouth      vit A/vit C/vit E/zinc/copper (ICAPS AREDS ORAL) Take by mouth       No current facility-administered medications on file prior to visit.     Family History Family History Problem Relation Age of Onset  Colon cancer Brother        Social History   Tobacco Use Smoking Status Former  Types: Cigarettes  Quit date: 2018  Years since quitting: 6.2 Smokeless Tobacco Never     Social History Social History    Socioeconomic History  Marital status: Widowed Tobacco Use  Smoking status: Former     Types: Cigarettes     Quit date: 2018     Years since quitting: 6.2  Smokeless tobacco: Never      Objective:     Vitals:   10/24/22 1335 BP: (!) 144/64 Pulse: 55 Weight: 92.3 kg (203 lb 6.4 oz) Height: 182.9 cm (6') PainSc: 0-No pain   Body mass index is 27.59 kg/m.     Physical Exam Constitutional:      General: He is not in acute distress.    Appearance: Normal appearance.  HENT:     Head: Normocephalic.     Nose: No rhinorrhea.     Mouth/Throat:     Mouth: Mucous membranes are moist.     Pharynx: Oropharynx is clear.  Eyes:     General: No scleral icterus.    Pupils: Pupils are equal, round, and reactive to light.  Cardiovascular:     Rate and Rhythm: Normal rate.     Pulses: Normal pulses.  Pulmonary:     Effort: Pulmonary effort is normal. No respiratory distress.     Breath sounds: No stridor. No wheezing.  Abdominal:     General: Abdomen is flat. There is no distension.     Tenderness: There is no abdominal tenderness. There is no guarding or rebound.     Hernia: A hernia is present. Hernia is present in the umbilical area.  Musculoskeletal:        General: Normal range  of motion.     Cervical back: Normal range of motion and neck supple.  Skin:    General: Skin is warm and dry.     Capillary Refill: Capillary refill takes less than 2 seconds.     Coloration: Skin is not jaundiced.  Neurological:     General: No focal deficit present.     Mental Status: He is alert and oriented to person, place, and time. Mental status is at baseline.  Psychiatric:        Mood and Affect: Mood normal.        Thought Content: Thought content normal.        Judgment: Judgment normal.        Hernia Size: 2 cm Incarcerated: Yes Initial Hernia     Assessment and Plan: Diagnoses and all orders for this visit:   Umbilical hernia without obstruction or gangrene     Terry Morales is a 77 y.o. male    1.  We will proceed to the OR for a lap ventral hernia repair with mesh. 2. All risks and benefits were discussed with the patient, to generally include infection, bleeding, damage to surrounding structures, acute and chronic nerve pain, and recurrence. Alternatives were offered and described.  All questions were answered and the patient voiced understanding of the procedure and wishes to proceed at this point.             No follow-ups on file.   Mykala Mccready, MD, FACS [facility]  

## 2022-10-24 NOTE — H&P (Signed)
Chief Complaint: Hernia       History of Present Illness: Terry Morales is a 77 y.o. male who is seen today as an office consultation at the request of Dr. Martinique for evaluation of Hernia .         Patient is a 77 year old male who comes in with a history of PVD, hyperlipidemia, and umbilical hernia.  He states the hernias been there for approximately a year.  He states that it is painful at times.  He is notices pain discomfort when he bumps into something.  He states that he had no previous abdominal surgery.  He had no signs or symptoms of strangulation.         Review of Systems: A complete review of systems was obtained from the patient.  I have reviewed this information and discussed as appropriate with the patient.  See HPI as well for other ROS.   Review of Systems  Constitutional:  Negative for fever.  HENT:  Negative for congestion.   Eyes:  Negative for blurred vision.  Respiratory:  Negative for cough, shortness of breath and wheezing.   Cardiovascular:  Negative for chest pain and palpitations.  Gastrointestinal:  Negative for heartburn.  Genitourinary:  Negative for dysuria.  Musculoskeletal:  Negative for myalgias.  Skin:  Negative for rash.  Neurological:  Negative for dizziness and headaches.  Psychiatric/Behavioral:  Negative for depression and suicidal ideas.   All other systems reviewed and are negative.       Medical History: Past Medical History Past Medical History: Diagnosis Date  Hyperlipidemia        There is no problem list on file for this patient.     Past Surgical History History reviewed. No pertinent surgical history.     Allergies No Known Allergies    Current Outpatient Medications on File Prior to Visit Medication Sig Dispense Refill  acetaminophen (TYLENOL) 500 MG tablet Take by mouth      amLODIPine (NORVASC) 5 MG tablet Take by mouth      aspirin 81 MG EC tablet Take by mouth      atorvastatin (LIPITOR) 80 MG  tablet Take by mouth      cilostazoL (PLETAL) 50 MG tablet Take 1 tablet by mouth 2 (two) times daily      glucosamine/chondr su A sod (OSTEO BI-FLEX ORAL) Take by mouth      multivitamin (MULTIPLE VITAMINS ORAL) Take by mouth      silodosin (RAPAFLO) 8 mg capsule Take by mouth      vit A/vit C/vit E/zinc/copper (ICAPS AREDS ORAL) Take by mouth       No current facility-administered medications on file prior to visit.     Family History Family History Problem Relation Age of Onset  Colon cancer Brother        Social History   Tobacco Use Smoking Status Former  Types: Cigarettes  Quit date: 2018  Years since quitting: 6.2 Smokeless Tobacco Never     Social History Social History    Socioeconomic History  Marital status: Widowed Tobacco Use  Smoking status: Former     Types: Cigarettes     Quit date: 2018     Years since quitting: 6.2  Smokeless tobacco: Never      Objective:     Vitals:   10/24/22 1335 BP: (!) 144/64 Pulse: 55 Weight: 92.3 kg (203 lb 6.4 oz) Height: 182.9 cm (6') PainSc: 0-No pain   Body mass index is 27.59 kg/m.  Physical Exam Constitutional:      General: He is not in acute distress.    Appearance: Normal appearance.  HENT:     Head: Normocephalic.     Nose: No rhinorrhea.     Mouth/Throat:     Mouth: Mucous membranes are moist.     Pharynx: Oropharynx is clear.  Eyes:     General: No scleral icterus.    Pupils: Pupils are equal, round, and reactive to light.  Cardiovascular:     Rate and Rhythm: Normal rate.     Pulses: Normal pulses.  Pulmonary:     Effort: Pulmonary effort is normal. No respiratory distress.     Breath sounds: No stridor. No wheezing.  Abdominal:     General: Abdomen is flat. There is no distension.     Tenderness: There is no abdominal tenderness. There is no guarding or rebound.     Hernia: A hernia is present. Hernia is present in the umbilical area.  Musculoskeletal:        General: Normal range  of motion.     Cervical back: Normal range of motion and neck supple.  Skin:    General: Skin is warm and dry.     Capillary Refill: Capillary refill takes less than 2 seconds.     Coloration: Skin is not jaundiced.  Neurological:     General: No focal deficit present.     Mental Status: He is alert and oriented to person, place, and time. Mental status is at baseline.  Psychiatric:        Mood and Affect: Mood normal.        Thought Content: Thought content normal.        Judgment: Judgment normal.        Hernia Size: 2 cm Incarcerated: Yes Initial Hernia     Assessment and Plan: Diagnoses and all orders for this visit:   Umbilical hernia without obstruction or gangrene     Terry Morales is a 77 y.o. male    1.  We will proceed to the OR for a lap ventral hernia repair with mesh. 2. All risks and benefits were discussed with the patient, to generally include infection, bleeding, damage to surrounding structures, acute and chronic nerve pain, and recurrence. Alternatives were offered and described.  All questions were answered and the patient voiced understanding of the procedure and wishes to proceed at this point.             No follow-ups on file.   Ralene Ok, MD, [facility], [place] [facility] Minimally Invasive Surgery

## 2022-10-25 ENCOUNTER — Other Ambulatory Visit: Payer: Self-pay | Admitting: Family Medicine

## 2022-10-25 DIAGNOSIS — I10 Essential (primary) hypertension: Secondary | ICD-10-CM

## 2022-10-25 NOTE — Pre-Procedure Instructions (Signed)
Surgical Instructions    Your procedure is scheduled on November 02, 2022.  Report to [facility] Main Entrance "A" at 1:45 P.M., then check in with the Admitting office.  Call this number if you have problems the morning of surgery:  [phone]  If you have any questions prior to your surgery date call [phone]: Open Monday-Friday 8am-4pm If you experience any cold or flu symptoms such as cough, fever, chills, shortness of breath, etc. between now and your scheduled surgery, please notify us at the above number.     Remember:  Do not eat after midnight the night before your surgery  You may drink clear liquids until 12:45 PM the afternoon of your surgery.   Clear liquids allowed are: Water, Non-Citrus Juices (without pulp), Carbonated Beverages, Clear Tea, Black Coffee Only (NO MILK, CREAM OR POWDERED CREAMER of any kind), and Gatorade.     Take these medicines the morning of surgery with A SIP OF WATER:  amLODipine (NORVASC)   atorvastatin (LIPITOR)    silodosin (RAPAFLO)      May take these medicines IF NEEDED:  acetaminophen (TYLENOL)   Loratadine (CLARITIN)   Follow your surgeon's instructions on when to stop Aspirin and cilostazol (PLETAL).  If no instructions were given by your surgeon then you will need to call the office to get those instructions.     As of today, STOP taking any Aspirin (unless otherwise instructed by your surgeon) Aleve, Naproxen, Ibuprofen, Motrin, Advil, Goody's, BC's, all herbal medications, fish oil, and all vitamins.                     Do NOT Smoke (Tobacco/Vaping) for 24 hours prior to your procedure.  If you use a CPAP at night, you may bring your mask/headgear for your overnight stay.   Contacts, glasses, piercing's, hearing aid's, dentures or partials may not be worn into surgery, please bring cases for these belongings.    For patients admitted to the hospital, discharge time will be determined by your treatment team.   Patients  discharged the day of surgery will not be allowed to drive home, and someone needs to stay with them for 24 hours.  SURGICAL WAITING ROOM VISITATION Patients having surgery or a procedure may have no more than 2 support people in the waiting area - these visitors may rotate.   Children under the age of 55 must have an adult with them who is not the patient. If the patient needs to stay at the hospital during part of their recovery, the visitor guidelines for inpatient rooms apply. Pre-op nurse will coordinate an appropriate time for 1 support person to accompany patient in pre-op.  This support person may not rotate.   Please refer to the [facility] website for the visitor guidelines for Inpatients (after your surgery is over and you are in a regular room).    Special instructions:   [place]- Preparing For Surgery  Before surgery, you can play an important role. Because skin is not sterile, your skin needs to be as free of germs as possible. You can reduce the number of germs on your skin by washing with CHG (chlorahexidine gluconate) Soap before surgery.  CHG is an antiseptic cleaner which kills germs and bonds with the skin to continue killing germs even after washing.    Oral Hygiene is also important to reduce your risk of infection.  Remember - BRUSH YOUR TEETH THE MORNING OF SURGERY WITH YOUR REGULAR TOOTHPASTE  Please do not use if you have an allergy to CHG or antibacterial soaps. If your skin becomes reddened/irritated stop using the CHG.  Do not shave (including legs and underarms) for at least 48 hours prior to first CHG shower. It is OK to shave your face.  Please follow these instructions carefully.   Shower the NIGHT BEFORE SURGERY and the MORNING OF SURGERY  If you chose to wash your hair, wash your hair first as usual with your normal shampoo.  After you shampoo, rinse your hair and body thoroughly to remove the shampoo.  Use CHG Soap as you would any other liquid  soap. You can apply CHG directly to the skin and wash gently with a scrungie or a clean washcloth.   Apply the CHG Soap to your body ONLY FROM THE NECK DOWN.  Do not use on open wounds or open sores. Avoid contact with your eyes, ears, mouth and genitals (private parts). Wash Face and genitals (private parts)  with your normal soap.   Wash thoroughly, paying special attention to the area where your surgery will be performed.  Thoroughly rinse your body with warm water from the neck down.  DO NOT shower/wash with your normal soap after using and rinsing off the CHG Soap.  Pat yourself dry with a CLEAN TOWEL.  Wear CLEAN PAJAMAS to bed the night before surgery  Place CLEAN SHEETS on your bed the night before your surgery  DO NOT SLEEP WITH PETS.   Day of Surgery: Take a shower with CHG soap. Do not wear jewelry or makeup Do not wear lotions, powders, perfumes/colognes, or deodorant. Do not shave 48 hours prior to surgery.  Men may shave face and neck. Do not bring valuables to the hospital.  [facility] is not responsible for any belongings or valuables. Do not wear nail polish, gel polish, artificial nails, or any other type of covering on natural nails (fingers and toes) If you have artificial nails or gel coating that need to be removed by a nail salon, please have this removed prior to surgery. Artificial nails or gel coating may interfere with anesthesia's ability to adequately monitor your vital signs.  Wear Clean/Comfortable clothing the morning of surgery Remember to brush your teeth WITH YOUR REGULAR TOOTHPASTE.   Please read over the following fact sheets that you were given.    If you received a COVID test during your pre-op visit  it is requested that you wear a mask when out in public, stay away from anyone that may not be feeling well and notify your surgeon if you develop symptoms. If you have been in contact with anyone that has tested positive in the last 10 days  please notify you surgeon.

## 2022-10-26 ENCOUNTER — Encounter (HOSPITAL_COMMUNITY)
Admission: RE | Admit: 2022-10-26 | Discharge: 2022-10-26 | Disposition: A | Discharge status: Home / Self Care | Payer: Medicare Other | Source: Ambulatory Visit | Attending: General Surgery | Admitting: General Surgery

## 2022-10-26 ENCOUNTER — Other Ambulatory Visit: Payer: Self-pay

## 2022-10-26 ENCOUNTER — Encounter (HOSPITAL_COMMUNITY): Payer: Self-pay

## 2022-10-26 VITALS — BP 145/50 | HR 85 | Temp 97.7°F | Resp 20 | Ht 72.0 in | Wt 205.8 lb

## 2022-10-26 DIAGNOSIS — I739 Peripheral vascular disease, unspecified: Secondary | ICD-10-CM | POA: Insufficient documentation

## 2022-10-26 DIAGNOSIS — I10 Essential (primary) hypertension: Secondary | ICD-10-CM | POA: Insufficient documentation

## 2022-10-26 DIAGNOSIS — Z01812 Encounter for preprocedural laboratory examination: Secondary | ICD-10-CM | POA: Insufficient documentation

## 2022-10-26 DIAGNOSIS — K429 Umbilical hernia without obstruction or gangrene: Secondary | ICD-10-CM | POA: Insufficient documentation

## 2022-10-26 DIAGNOSIS — Z01818 Encounter for other preprocedural examination: Secondary | ICD-10-CM

## 2022-10-26 HISTORY — DX: Peripheral vascular disease, unspecified: I73.9

## 2022-10-26 LAB — BASIC METABOLIC PANEL WITH GFR
Anion gap: 10 (ref 5–15)
BUN: 19 mg/dL (ref 8–23)
CO2: 23 mmol/L (ref 22–32)
Calcium: 9.5 mg/dL (ref 8.9–10.3)
Chloride: 104 mmol/L (ref 98–111)
Creatinine, Ser: 1.16 mg/dL (ref 0.61–1.24)
GFR, Estimated: >60 mL/min (ref >60)
Glucose, Bld: 172 mg/dL — ABNORMAL HIGH (ref 70–99)
Potassium: 4.5 mmol/L (ref 3.5–5.1)
Sodium: 137 mmol/L (ref 135–145)

## 2022-10-26 LAB — CBC
HCT: 39.5 % (ref 39.0–52.0)
Hemoglobin: 13.3 g/dL (ref 13.0–17.0)
MCH: 32.8 pg (ref 26.0–34.0)
MCHC: 33.7 g/dL (ref 30.0–36.0)
MCV: 97.5 fL (ref 80.0–100.0)
Platelets: 271 10*3/uL (ref 150–400)
RBC: 4.05 MIL/uL — ABNORMAL LOW (ref 4.22–5.81)
RDW: 12.5 % (ref 11.5–15.5)
WBC: 7.9 10*3/uL (ref 4.0–10.5)
nRBC: 0 % (ref 0.0–0.2)

## 2022-10-26 NOTE — Progress Notes (Signed)
PCP - Dr. Betty Martinique Cardiologist - Dr. Quay Burow  PPM/ICD - Denies  Chest x-ray - N/A EKG - 02/26/22 Stress Test - 02/22/22 ECHO - Denies Cardiac Cath - Denies  Sleep Study - Denies  Diabetes: Denies  Blood Thinner Instructions: Hold Pletal 2-4 days prior to surgery. Aspirin Instructions: Hold ASA 7 days prior to surgery.  ERAS Protcol - Yes PRE-SURGERY Ensure or G2- No  COVID TEST- N/A   Anesthesia review: Yes, cardiac/afib hx; clearane on 06/28/22 In EPIC  Patient denies shortness of breath, fever, cough and chest pain at PAT appointment   All instructions explained to the patient, with a verbal understanding of the material. Patient agrees to go over the instructions while at home for a better understanding. Patient also instructed to self quarantine after being tested for COVID-19. The opportunity to ask questions was provided.

## 2022-10-27 ENCOUNTER — Encounter (HOSPITAL_COMMUNITY): Payer: Self-pay

## 2022-10-27 NOTE — Progress Notes (Signed)
Anesthesia Chart Review:   Case: [id number] Date/Time: 11/02/22 1530   Procedure: LAPAROSCOPIC UMBILICAL HERNIA  REPAIR WITH MESH   Anesthesia type: General   Pre-op diagnosis: UMBILICAL HERNIA 2CM INCARCERATED   [facility] ROOM 09 / [facility]   Surgeons: Axel Filler, MD       DISCUSSION: Pt is 77 years old with hx HTN, PAD (CTO L SFA and 70% R SFA per angiogram 2023)   Pt to hold pletal 2-4 days before surgery and hold ASA 7 days before surgery  VS: BP (!) 145/50   Pulse 85   Temp 36.5 C (Oral)   Resp 20   Ht 6' (1.829 m)   Wt 93.4 kg   SpO2 98%   BMI 27.91 kg/m   PROVIDERS: - PCP is [place], Betty G, MD - PV cardiologist is Nanetta Batty, MD who cleared pt for surgery at last office visit 06/28/22   LABS: Labs reviewed: Acceptable for surgery. (all labs ordered are listed, but only abnormal results are displayed)  Labs Reviewed  BASIC METABOLIC PANEL - Abnormal; Notable for the following components:      Result Value   Glucose, Bld 172 (*)    All other components within normal limits  CBC - Abnormal; Notable for the following components:   RBC 4.05 (*)    All other components within normal limits    EKG 02/23/22: Atrial fibrillation with rapid ventricular response with occasional PVCs. ST & T wave abnormality, consider lateral ischemia - This EKG was obtained pre-procedure for PV angiogram. Discharge summary 02/24/22 documents pt was in junctional tachycardia; HR slowed spontaneously; admitted overnight for obs and had brief intermittent tachycardia and continued frequent PVCs; pt had been taking claritin-D, instructed to avoid sudafed.  CV: Nuclear stress test 02/22/22:  Nonspecific <2mm ST depressions on stress EKG Mild systolic dysfunction (EF 50%), though had frequent PVCs that may have affected gating.  Consider echocardiogram to better evaluate systolic function Normal perfusion Low risk study  CT cardiac scoring 02/20/22:  - Coronary calcium score of 1397.  This was 58 percentile for age-, race-, and sex-matched controls.   Past Medical History:  Diagnosis Date   Hypertension    PAD (peripheral artery disease)    Prostate cancer     Past Surgical History:  Procedure Laterality Date   ABDOMINAL AORTOGRAM W/LOWER EXTREMITY N/A 02/23/2022   Procedure: ABDOMINAL AORTOGRAM W/LOWER EXTREMITY;  Surgeon: Runell Gess, MD;  Location: [facility] INVASIVE CV LAB;  Service: Cardiovascular;  Laterality: N/A;   ANKLE SURGERY     bilateral   CYSTOSCOPY     with dilation   GREEN LIGHT LASER TURP (TRANSURETHRAL RESECTION OF PROSTATE     PROSTATE BIOPSY     RADIOACTIVE SEED IMPLANT N/A 08/28/2019   Procedure: RADIOACTIVE SEED IMPLANT/BRACHYTHERAPY IMPLANT;  Surgeon: Bjorn Pippin, MD;  Location: [facility];  Service: Urology;  Laterality: N/A;   SPACE OAR INSTILLATION N/A 08/28/2019   Procedure: SPACE OAR INSTILLATION;  Surgeon: Bjorn Pippin, MD;  Location: [facility];  Service: Urology;  Laterality: N/A;   TONSILLECTOMY      MEDICATIONS:  acetaminophen (TYLENOL) 500 MG tablet   amLODipine (NORVASC) 5 MG tablet   aspirin 81 MG EC tablet   atorvastatin (LIPITOR) 80 MG tablet   Boswellia-Glucosamine-Vit D (OSTEO BI-FLEX ONE PER DAY PO)   cilostazol (PLETAL) 50 MG tablet   loratadine-pseudoephedrine (CLARITIN-D 24-HOUR) 10-240 MG 24 hr tablet   Multiple Vitamins-Minerals (MENS MULTIVITAMIN) TABS  Multiple Vitamins-Minerals (PRESERVISION AREDS 2 PO)   silodosin (RAPAFLO) 8 MG CAPS capsule   No current facility-administered medications for this encounter.    If no changes, I anticipate pt can proceed with surgery as scheduled.   Rica MastAngela Joreen Swearingin, PhD, FNP-BC [facility]iology Phone: [phone] 10/27/2022 3:50 PM

## 2022-10-27 NOTE — Anesthesia Preprocedure Evaluation (Signed)
Anesthesia Evaluation  Patient identified by MRN, date of birth, ID band Patient awake    Reviewed: Allergy & Precautions, NPO status , Patient's Chart, lab work & pertinent test results, reviewed documented beta blocker date and time   Airway Mallampati: II       Dental no notable dental hx. (+) Teeth Intact, Caps, Dental Advisory Given   Pulmonary former smoker   Pulmonary exam normal breath sounds clear to auscultation       Cardiovascular hypertension, Pt. on medications + CAD and + Peripheral Vascular Disease  Normal cardiovascular exam Rhythm:Regular Rate:Normal     Neuro/Psych negative neurological ROS  negative psych ROS   GI/Hepatic negative GI ROS, Neg liver ROS,,,  Endo/Other  Hyperlipidemia  Renal/GU negative Renal ROS   Hx/o prostate Ca S/P seeds and TURP    Musculoskeletal negative musculoskeletal ROS (+)    Abdominal   Peds  Hematology negative hematology ROS (+)   Anesthesia Other Findings   Reproductive/Obstetrics                             Anesthesia Physical Anesthesia Plan  ASA: 2  Anesthesia Plan: General   Post-op Pain Management: Minimal or no pain anticipated   Induction: Intravenous  PONV Risk Score and Plan: 4 or greater and Treatment may vary due to age or medical condition and Ondansetron  Airway Management Planned: Oral ETT  Additional Equipment: None  Intra-op Plan:   Post-operative Plan: Extubation in OR  Informed Consent: I have reviewed the patients History and Physical, chart, labs and discussed the procedure including the risks, benefits and alternatives for the proposed anesthesia with the patient or authorized representative who has indicated his/her understanding and acceptance.     Dental advisory given  Plan Discussed with: CRNA and Anesthesiologist  Anesthesia Plan Comments: (See APP note by Joslyn Hy, FNP )        Anesthesia Quick Evaluation

## 2022-11-01 NOTE — Progress Notes (Signed)
patient's daughter voiced understanding of new arrival time of 66 tomorrow. Clear liquids okay until 0915.

## 2022-11-02 ENCOUNTER — Ambulatory Visit (HOSPITAL_COMMUNITY): Payer: Medicare Other | Admitting: Vascular Surgery

## 2022-11-02 ENCOUNTER — Encounter (HOSPITAL_COMMUNITY)
Admission: RE | Disposition: A | Discharge status: Home / Self Care | Payer: Self-pay | Source: Home / Self Care | Attending: General Surgery

## 2022-11-02 ENCOUNTER — Ambulatory Visit (HOSPITAL_BASED_OUTPATIENT_CLINIC_OR_DEPARTMENT_OTHER): Payer: Medicare Other | Admitting: Certified Registered Nurse Anesthetist

## 2022-11-02 ENCOUNTER — Encounter (HOSPITAL_COMMUNITY): Payer: Self-pay | Admitting: General Surgery

## 2022-11-02 ENCOUNTER — Other Ambulatory Visit: Payer: Self-pay

## 2022-11-02 ENCOUNTER — Ambulatory Visit (HOSPITAL_COMMUNITY)
Admission: RE | Admit: 2022-11-02 | Discharge: 2022-11-02 | Disposition: A | Discharge status: Home / Self Care | Payer: Medicare Other | Attending: General Surgery | Admitting: General Surgery

## 2022-11-02 DIAGNOSIS — I10 Essential (primary) hypertension: Secondary | ICD-10-CM | POA: Insufficient documentation

## 2022-11-02 DIAGNOSIS — I2510 Atherosclerotic heart disease of native coronary artery without angina pectoris: Secondary | ICD-10-CM | POA: Insufficient documentation

## 2022-11-02 DIAGNOSIS — Z8546 Personal history of malignant neoplasm of prostate: Secondary | ICD-10-CM | POA: Diagnosis not present

## 2022-11-02 DIAGNOSIS — Z87891 Personal history of nicotine dependence: Secondary | ICD-10-CM | POA: Insufficient documentation

## 2022-11-02 DIAGNOSIS — I739 Peripheral vascular disease, unspecified: Secondary | ICD-10-CM | POA: Insufficient documentation

## 2022-11-02 DIAGNOSIS — K440 Diaphragmatic hernia with obstruction, without gangrene: Secondary | ICD-10-CM | POA: Insufficient documentation

## 2022-11-02 DIAGNOSIS — E785 Hyperlipidemia, unspecified: Secondary | ICD-10-CM | POA: Diagnosis not present

## 2022-11-02 DIAGNOSIS — K420 Umbilical hernia with obstruction, without gangrene: Secondary | ICD-10-CM

## 2022-11-02 HISTORY — PX: UMBILICAL HERNIA REPAIR: SHX196

## 2022-11-02 HISTORY — PX: INSERTION OF MESH: SHX5868

## 2022-11-02 SURGERY — REPAIR, HERNIA, UMBILICAL, LAPAROSCOPIC
Anesthesia: General | Site: Abdomen

## 2022-11-02 MED ORDER — PROPOFOL 10 MG/ML IV BOLUS
INTRAVENOUS | Status: AC
  Filled 2022-11-02: qty 20

## 2022-11-02 MED ORDER — ONDANSETRON HCL 4 MG/2ML IJ SOLN
INTRAMUSCULAR | Status: DC | PRN
  Administered 2022-11-02: 4 mg via INTRAVENOUS

## 2022-11-02 MED ORDER — LACTATED RINGERS IV SOLN: INTRAVENOUS | Status: DC

## 2022-11-02 MED ORDER — OXYCODONE HCL 5 MG/5ML PO SOLN: 5.0000 mg | Freq: Once | ORAL | Status: AC | PRN

## 2022-11-02 MED ORDER — OXYCODONE HCL 5 MG PO TABS
ORAL_TABLET | ORAL | Status: AC
  Filled 2022-11-02: qty 1

## 2022-11-02 MED ORDER — CEFAZOLIN SODIUM-DEXTROSE 2-4 GM/100ML-% IV SOLN
2.0000 g | INTRAVENOUS | Status: AC
  Administered 2022-11-02: 2 g via INTRAVENOUS
  Filled 2022-11-02: qty 100

## 2022-11-02 MED ORDER — LIDOCAINE 2% (20 MG/ML) 5 ML SYRINGE
INTRAMUSCULAR | Status: AC
  Filled 2022-11-02: qty 5

## 2022-11-02 MED ORDER — HYDROMORPHONE HCL 1 MG/ML IJ SOLN
0.2500 mg | INTRAMUSCULAR | Status: DC | PRN
  Administered 2022-11-02 (×4): 0.25 mg via INTRAVENOUS

## 2022-11-02 MED ORDER — ONDANSETRON HCL 4 MG/2ML IJ SOLN: 4.0000 mg | Freq: Once | INTRAMUSCULAR | Status: DC | PRN

## 2022-11-02 MED ORDER — PROPOFOL 10 MG/ML IV BOLUS
INTRAVENOUS | Status: DC | PRN
  Administered 2022-11-02: 150 mg via INTRAVENOUS

## 2022-11-02 MED ORDER — FENTANYL CITRATE (PF) 250 MCG/5ML IJ SOLN
INTRAMUSCULAR | Status: AC
  Filled 2022-11-02: qty 5

## 2022-11-02 MED ORDER — HYDROMORPHONE HCL 1 MG/ML IJ SOLN
INTRAMUSCULAR | Status: AC
  Filled 2022-11-02: qty 1

## 2022-11-02 MED ORDER — OXYCODONE HCL 5 MG PO TABS
5.0000 mg | ORAL_TABLET | Freq: Once | ORAL | Status: AC | PRN
  Administered 2022-11-02: 5 mg via ORAL

## 2022-11-02 MED ORDER — FENTANYL CITRATE (PF) 250 MCG/5ML IJ SOLN
INTRAMUSCULAR | Status: DC | PRN
  Administered 2022-11-02: 150 ug via INTRAVENOUS

## 2022-11-02 MED ORDER — SUGAMMADEX SODIUM 200 MG/2ML IV SOLN
INTRAVENOUS | Status: DC | PRN
  Administered 2022-11-02: 200 mg via INTRAVENOUS

## 2022-11-02 MED ORDER — ORAL CARE MOUTH RINSE: 15.0000 mL | Freq: Once | OROMUCOSAL | Status: AC

## 2022-11-02 MED ORDER — TRAMADOL HCL 50 MG PO TABS: 50.0000 mg | ORAL_TABLET | Freq: Four times a day (QID) | ORAL | 0 refills | Status: AC | PRN

## 2022-11-02 MED ORDER — PHENYLEPHRINE HCL-NACL 20-0.9 MG/250ML-% IV SOLN
INTRAVENOUS | Status: DC | PRN
  Administered 2022-11-02: 40 ug/min via INTRAVENOUS

## 2022-11-02 MED ORDER — 0.9 % SODIUM CHLORIDE (POUR BTL) OPTIME
TOPICAL | Status: DC | PRN
  Administered 2022-11-02: 1000 mL

## 2022-11-02 MED ORDER — ROCURONIUM BROMIDE 10 MG/ML (PF) SYRINGE
PREFILLED_SYRINGE | INTRAVENOUS | Status: AC
  Filled 2022-11-02: qty 10

## 2022-11-02 MED ORDER — ONDANSETRON HCL 4 MG/2ML IJ SOLN
INTRAMUSCULAR | Status: AC
  Filled 2022-11-02: qty 2

## 2022-11-02 MED ORDER — CHLORHEXIDINE GLUCONATE CLOTH 2 % EX PADS: 6.0000 | MEDICATED_PAD | Freq: Once | CUTANEOUS | Status: DC

## 2022-11-02 MED ORDER — BUPIVACAINE HCL 0.25 % IJ SOLN
INTRAMUSCULAR | Status: DC | PRN
  Administered 2022-11-02: 17 mL

## 2022-11-02 MED ORDER — LIDOCAINE 2% (20 MG/ML) 5 ML SYRINGE
INTRAMUSCULAR | Status: DC | PRN
  Administered 2022-11-02: 80 mg via INTRAVENOUS

## 2022-11-02 MED ORDER — BUPIVACAINE HCL (PF) 0.25 % IJ SOLN
INTRAMUSCULAR | Status: AC
  Filled 2022-11-02: qty 30

## 2022-11-02 MED ORDER — DEXAMETHASONE SODIUM PHOSPHATE 10 MG/ML IJ SOLN
INTRAMUSCULAR | Status: AC
  Filled 2022-11-02: qty 1

## 2022-11-02 MED ORDER — ROCURONIUM BROMIDE 10 MG/ML (PF) SYRINGE
PREFILLED_SYRINGE | INTRAVENOUS | Status: DC | PRN
  Administered 2022-11-02: 70 mg via INTRAVENOUS

## 2022-11-02 MED ORDER — CHLORHEXIDINE GLUCONATE 0.12 % MT SOLN
15.0000 mL | Freq: Once | OROMUCOSAL | Status: AC
  Administered 2022-11-02: 15 mL via OROMUCOSAL
  Filled 2022-11-02: qty 15

## 2022-11-02 SURGICAL SUPPLY — 39 items
ADH SKN CLS APL DERMABOND .7 (GAUZE/BANDAGES/DRESSINGS) ×2
APL PRP STRL LF DISP 70% ISPRP (MISCELLANEOUS) ×2
BAG COUNTER SPONGE SURGICOUNT (BAG) ×2 IMPLANT
BAG SPNG CNTER NS LX DISP (BAG) ×2
BLADE CLIPPER SURG (BLADE) IMPLANT
CHLORAPREP W/TINT 26 (MISCELLANEOUS) ×2 IMPLANT
COVER SURGICAL LIGHT HANDLE (MISCELLANEOUS) ×2 IMPLANT
DERMABOND ADVANCED .7 DNX12 (GAUZE/BANDAGES/DRESSINGS) ×2 IMPLANT
DEVICE SECURE STRAP 25 ABSORB (INSTRUMENTS) ×2 IMPLANT
DEVICE TROCAR PUNCTURE CLOSURE (ENDOMECHANICALS) ×2 IMPLANT
ELECT REM PT RETURN 9FT ADLT (ELECTROSURGICAL) ×2
ELECTRODE REM PT RTRN 9FT ADLT (ELECTROSURGICAL) ×2 IMPLANT
GLOVE BIO SURGEON STRL SZ7.5 (GLOVE) ×4 IMPLANT
GOWN STRL REUS W/ TWL LRG LVL3 (GOWN DISPOSABLE) ×4 IMPLANT
GOWN STRL REUS W/ TWL XL LVL3 (GOWN DISPOSABLE) ×2 IMPLANT
GOWN STRL REUS W/TWL LRG LVL3 (GOWN DISPOSABLE) ×4
GOWN STRL REUS W/TWL XL LVL3 (GOWN DISPOSABLE) ×2
KIT BASIN OR (CUSTOM PROCEDURE TRAY) ×2 IMPLANT
KIT TURNOVER KIT B (KITS) ×2 IMPLANT
MARKER SKIN DUAL TIP RULER LAB (MISCELLANEOUS) ×2 IMPLANT
MESH VENTRALIGHT ST 4X6IN (Mesh General) IMPLANT
NDL INSUFFLATION 14GA 120MM (NEEDLE) ×2 IMPLANT
NEEDLE INSUFFLATION 14GA 120MM (NEEDLE) ×2 IMPLANT
NS IRRIG 1000ML POUR BTL (IV SOLUTION) ×2 IMPLANT
PAD ARMBOARD 7.5X6 YLW CONV (MISCELLANEOUS) ×4 IMPLANT
SCISSORS LAP 5X35 DISP (ENDOMECHANICALS) ×2 IMPLANT
SET TUBE SMOKE EVAC HIGH FLOW (TUBING) ×2 IMPLANT
SLEEVE Z-THREAD 5X100MM (TROCAR) ×2 IMPLANT
SUT CHROMIC 2 0 SH (SUTURE) ×2 IMPLANT
SUT ETHIBOND 0 MO6 C/R (SUTURE) IMPLANT
SUT MNCRL AB 4-0 PS2 18 (SUTURE) ×2 IMPLANT
SUT NOVA 1 T20/GS 25DT (SUTURE) IMPLANT
SUT PROLENE 2 0 KS (SUTURE) ×4 IMPLANT
TOWEL GREEN STERILE (TOWEL DISPOSABLE) ×2 IMPLANT
TOWEL GREEN STERILE FF (TOWEL DISPOSABLE) ×2 IMPLANT
TRAY LAPAROSCOPIC MC (CUSTOM PROCEDURE TRAY) ×2 IMPLANT
TROCAR Z-THREAD OPTICAL 5X100M (TROCAR) ×2 IMPLANT
WARMER LAPAROSCOPE (MISCELLANEOUS) ×2 IMPLANT
WATER STERILE IRR 1000ML POUR (IV SOLUTION) ×2 IMPLANT

## 2022-11-02 NOTE — Interval H&P Note (Signed)
History and Physical Interval Note:  11/02/2022 11:05 AM  Terry Morales  has presented today for surgery, with the diagnosis of UMBILICAL HERNIA 2CM INCARCERATED.  The various methods of treatment have been discussed with the patient and family. After consideration of risks, benefits and other options for treatment, the patient has consented to  Procedure(s): LAPAROSCOPIC UMBILICAL HERNIA  REPAIR WITH MESH (N/A) as a surgical intervention.  The patient's history has been reviewed, patient examined, no change in status, stable for surgery.  I have reviewed the patient's chart and labs.  Questions were answered to the patient's satisfaction.     Axel Filler

## 2022-11-02 NOTE — Discharge Instructions (Addendum)
[facility] _______[facility]  HERNIA REPAIR: POST OP INSTRUCTIONS  Always review your discharge instruction sheet given to you by the facility where your surgery was performed. IF YOU HAVE DISABILITY OR FAMILY LEAVE FORMS, YOU MUST BRING THEM TO THE OFFICE FOR PROCESSING.   DO NOT GIVE THEM TO YOUR DOCTOR.  1. A  prescription for pain medication may be given to you upon discharge.  Take your pain medication as prescribed, if needed.  If narcotic pain medicine is not needed, then you may take acetaminophen (Tylenol) or ibuprofen (Advil) as needed. 2. Take your usually prescribed medications unless otherwise directed. If you need a refill on your pain medication, please contact your pharmacy.  They will contact our office to request authorization. Prescriptions will not be filled after 5 pm or on week-ends. 3. You should follow a light diet the first 24 hours after arrival home, such as soup and crackers, etc.  Be sure to include lots of fluids daily.  Resume your normal diet the day after surgery. 4.Most patients will experience some swelling and bruising around the umbilicus or in the groin and scrotum.  Ice packs and reclining will help.  Swelling and bruising can take several days to resolve.  6. It is common to experience some constipation if taking pain medication after surgery.  Increasing fluid intake and taking a stool softener (such as Colace) will usually help or prevent this problem from occurring.  A mild laxative (Milk of Magnesia or Miralax) should be taken according to package directions if there are no bowel movements after 48 hours. 7. Unless discharge instructions indicate otherwise, you may remove your bandages 24-48 hours after surgery, and you may shower at that time.  You may have steri-strips (small skin tapes) in place directly over the incision.  These strips should be left on the skin for 7-10 days.  If your surgeon used skin glue on the incision, you may shower in  24 hours.  The glue will flake off over the next 2-3 weeks.  Any sutures or staples will be removed at the office during your follow-up visit. 8. ACTIVITIES:  You may resume regular (light) daily activities beginning the next day--such as daily self-care, walking, climbing stairs--gradually increasing activities as tolerated.  You may have sexual intercourse when it is comfortable.  Refrain from any heavy lifting or straining until approved by your doctor.  a.You may drive when you are no longer taking prescription pain medication, you can comfortably wear a seatbelt, and you can safely maneuver your car and apply brakes. b.RETURN TO WORK:   _____________________________________________  9.You should see your doctor in the office for a follow-up appointment approximately 2-3 weeks after your surgery.  Make sure that you call for this appointment within a day or two after you arrive home to insure a convenient appointment time. 10.OTHER INSTRUCTIONS: _________________________    _____________________________________  WHEN TO CALL YOUR DOCTOR: Fever over 101.0 Inability to urinate Nausea and/or vomiting Extreme swelling or bruising Continued bleeding from incision. Increased pain, redness, or drainage from the incision  The clinic staff is available to answer your questions during regular business hours.  Please don't hesitate to call and ask to speak to one of the nurses for clinical concerns.  If you have a medical emergency, go to the nearest emergency room or call 911.  A surgeon from [facility] is always on call at the hospital   [address], [address], [place], [place]    [address] ?  [address] [address], [place], [place]   [address] [phone] ? [phone] ? FAX [phone] Web site: [email or web address]  

## 2022-11-02 NOTE — Op Note (Signed)
11/02/2022  12:16 PM  PATIENT:  Terry Morales  77 y.o. male  PRE-OPERATIVE DIAGNOSIS:  UMBILICAL HERNIA 2CM INCARCERATED  POST-OPERATIVE DIAGNOSIS:  UMBILICAL HERNIA 2CM INCARCERATED  PROCEDURE:  Procedure(s): LAPAROSCOPIC UMBILICAL HERNIA  REPAIR WITH MESH (N/A) INSERTION OF MESH  SURGEON:  Surgeon(s) and Role:    * Axel Filler, MD - Primary  ASSISTANTS: Jeronimo Greaves, RNFA   ANESTHESIA:   local and general  EBL:  minimal   BLOOD ADMINISTERED:none  DRAINS: none   LOCAL MEDICATIONS USED:  BUPIVICAINE   SPECIMEN:  No Specimen  DISPOSITION OF SPECIMEN:  N/A  COUNTS:  YES  TOURNIQUET:  * No tourniquets in log *  DICTATION: .Dragon Dictation  Findings: 2cm UH, 10x15cm ventralight mesh  Details of the procedure:  After the patient was consented patient was taken back to the operating room patient was then placed in supine position bilateral SCDs in place.  The patient was prepped and draped in the usual sterile fashion. After antibiotics were confirmed a timeout was called and all facts were verified. The Veress needle technique was used to insuflate the abdomen at Palmer's point. The abdomen was insufflated to 14 mm mercury. Subsequently a 5 mm trocar was placed a camera inserted there was no injury to any intra-abdominal organs.    There was seen to be an non-incarcerated  2cm umbilical  hernia.  A second camera port was in placed into the left lower quadrant.   At this the Falicform ligament was taken down with Bovie cautery maintaining hemostasis.  I proceeded to reduce the hernia contents.  The hernia sac was dissected out of the hernia and disposed.  The fascia at the hernia was reapproximated using a #0 Ethibonds x 2.  Once the hernia was cleared away, a Bard Ventralight 10x15cm  mesh was inserted into the abdomen.  The mesh was secured circumferentially with am Securestrap tacker in a double crown fashion.  The omentum was brought over the area of the mesh.  The pneumoperitoneum was evacuated  & all trocars  were removed. The skin was reapproximated with 4-0  Monocryl sutures in a subcuticular fashion. The skin was dressed with Dermabond.  The patient was taken to the recovery room in stable condition.  Type of repair -primary suture & mesh  Mesh overlap - 5cm  Placement of mesh -  beneath fascia and into peritoneal cavity  Size: 3cm, Primary Hernia, and Reducible Hernia   PLAN OF CARE: Discharge to home after PACU  PATIENT DISPOSITION:  PACU - hemodynamically stable.   Delay start of Pharmacological VTE agent (>24hrs) due to surgical blood loss or risk of bleeding: not applicable

## 2022-11-02 NOTE — Transfer of Care (Signed)
Immediate Anesthesia Transfer of Care Note  Patient: Terry Morales  Procedure(s) Performed: LAPAROSCOPIC UMBILICAL HERNIA  REPAIR WITH MESH (Abdomen) INSERTION OF MESH (Abdomen)  Patient Location: PACU  Anesthesia Type:General  Level of Consciousness: awake, alert , and oriented  Airway & Oxygen Therapy: Patient Spontanous Breathing and Patient connected to nasal cannula oxygen  Post-op Assessment: Report given to RN and Post -op Vital signs reviewed and stable  Post vital signs: Reviewed and stable  Last Vitals:  Vitals Value Taken Time  BP 137/69 11/02/22 1230  Temp 36.7 C 11/02/22 1230  Pulse 66 11/02/22 1235  Resp 12 11/02/22 1235  SpO2 98 % 11/02/22 1235  Vitals shown include unvalidated device data.  Last Pain:  Vitals:   11/02/22 1230  TempSrc:   PainSc: 0-No pain      Patients Stated Pain Goal: 0 (11/02/22 1036)  Complications: No notable events documented.

## 2022-11-02 NOTE — Anesthesia Postprocedure Evaluation (Signed)
Anesthesia Post Note  Patient: Terry Morales  Procedure(s) Performed: LAPAROSCOPIC UMBILICAL HERNIA  REPAIR WITH MESH (Abdomen) INSERTION OF MESH (Abdomen)     Patient location during evaluation: PACU Anesthesia Type: General Level of consciousness: awake and alert and oriented Pain management: pain level controlled Vital Signs Assessment: post-procedure vital signs reviewed and stable Respiratory status: spontaneous breathing, nonlabored ventilation and respiratory function stable Cardiovascular status: blood pressure returned to baseline and stable Postop Assessment: no apparent nausea or vomiting Anesthetic complications: no   No notable events documented.  Last Vitals:  Vitals:   11/02/22 1315 11/02/22 1330  BP: (!) 143/78 (!) 148/68  Pulse: 67 68  Resp: 12 18  Temp: 36.7 C 36.7 C  SpO2: 96% 96%    Last Pain:  Vitals:   11/02/22 1330  TempSrc:   PainSc: 3                  Tylar Merendino A.

## 2022-11-02 NOTE — Anesthesia Procedure Notes (Signed)
Procedure Name: Intubation Date/Time: 11/02/2022 11:51 AM  Performed by: Samara Deist, CRNAPre-anesthesia Checklist: Patient identified, Emergency Drugs available, Suction available and Patient being monitored Patient Re-evaluated:Patient Re-evaluated prior to induction Oxygen Delivery Method: Circle System Utilized Preoxygenation: Pre-oxygenation with 100% oxygen Induction Type: IV induction Ventilation: Mask ventilation without difficulty Laryngoscope Size: Mac and 4 Grade View: Grade I Tube type: Oral Tube size: 7.5 mm Number of attempts: 1 Airway Equipment and Method: Stylet and Oral airway Placement Confirmation: ETT inserted through vocal cords under direct vision, positive ETCO2 and breath sounds checked- equal and bilateral Secured at: 22 cm Tube secured with: Tape Dental Injury: Teeth and Oropharynx as per pre-operative assessment

## 2022-11-03 ENCOUNTER — Encounter (HOSPITAL_COMMUNITY): Payer: Self-pay | Admitting: General Surgery

## 2022-11-17 DIAGNOSIS — Z8719 Personal history of other diseases of the digestive system: Secondary | ICD-10-CM | POA: Diagnosis not present

## 2022-11-17 DIAGNOSIS — K420 Umbilical hernia with obstruction, without gangrene: Secondary | ICD-10-CM | POA: Diagnosis not present

## 2022-11-26 ENCOUNTER — Other Ambulatory Visit: Payer: Self-pay | Admitting: Urology

## 2023-01-11 ENCOUNTER — Encounter: Payer: Medicare Other | Admitting: Family Medicine

## 2023-02-26 DIAGNOSIS — M545 Low back pain, unspecified: Secondary | ICD-10-CM | POA: Diagnosis not present

## 2023-03-14 ENCOUNTER — Other Ambulatory Visit: Payer: Self-pay | Admitting: Cardiovascular Disease

## 2023-06-14 ENCOUNTER — Ambulatory Visit: Payer: Medicare Other | Admitting: Adult Health

## 2023-06-14 VITALS — BP 140/52 | HR 68 | Temp 97.9°F | Ht 72.0 in | Wt 210.0 lb

## 2023-06-14 DIAGNOSIS — R600 Localized edema: Secondary | ICD-10-CM | POA: Diagnosis not present

## 2023-06-14 DIAGNOSIS — I739 Peripheral vascular disease, unspecified: Secondary | ICD-10-CM | POA: Diagnosis not present

## 2023-06-14 MED ORDER — FUROSEMIDE 20 MG PO TABS
20.0000 mg | ORAL_TABLET | Freq: Every day | ORAL | 0 refills | Status: DC
Start: 2023-06-14 — End: 2023-06-18

## 2023-06-14 NOTE — Progress Notes (Signed)
Subjective:    Patient ID: Terry Morales, male    DOB: 1946-05-01, 77 y.o.   MRN: [id number]  HPI 77 year old male who  has a past medical history of Hypertension, PAD (peripheral artery disease) (HCC), and Prostate cancer (HCC).  He is a patient of Dr. [place] who I am seeing today for lower extremity edema. His daughter is with him today   He has a history of lower extremity edema from peripheral vascular disease.  Had abdominal aortogram/bilateral iliac angiogram/5 femoral runoff with failed attempt at the left SFA CTO done in 2023.  Unsure when he noticed that his feet were becoming more swollen.  He has been using compression socks which do not seem to be helping.  He denies any calf pain, redness, warmth, or tenderness.  Swelling is bilateral but seems to be worse in the left foot.   Review of Systems See HPI   Past Medical History:  Diagnosis Date   Hypertension    PAD (peripheral artery disease) (HCC)    Prostate cancer (HCC)     Social History   Socioeconomic History   Marital status: Widowed    Spouse name: Not on file   Number of children: 2   Years of education: Not on file   Highest education level: Not on file  Occupational History   Occupation: Chartered certified accountant    Comment: retired  Tobacco Use   Smoking status: Former    Current packs/day: 0.00    Average packs/day: 1 pack/day for 60.0 years (60.0 ttl pk-yrs)    Types: Cigarettes    Start date: 07/25/1959    Quit date: 10/07/2015    Years since quitting: 7.6   Smokeless tobacco: Never  Vaping Use   Vaping status: Never Used  Substance and Sexual Activity   Alcohol use: Yes    Comment: occassionally   Drug use: Not Currently   Sexual activity: Not Currently  Other Topics Concern   Not on file  Social History Narrative   Not on file   Social Determinants of Health   Financial Resource Strain: Low Risk  (01/06/2022)   Overall Financial Resource Strain (CARDIA)    Difficulty of Paying Living Expenses: Not  hard at all  Food Insecurity: No Food Insecurity (01/06/2022)   Hunger Vital Sign    Worried About Running Out of Food in the Last Year: Never true    Ran Out of Food in the Last Year: Never true  Transportation Needs: No Transportation Needs (01/06/2022)   PRAPARE - Administrator, Civil Service (Medical): No    Lack of Transportation (Non-Medical): No  Physical Activity: Sufficiently Active (01/06/2022)   Exercise Vital Sign    Days of Exercise per Week: 7 days    Minutes of Exercise per Session: 150+ min  Stress: No Stress Concern Present (01/06/2022)   [employer] of Occupational Health - Occupational Stress Questionnaire    Feeling of Stress : Not at all  Social Connections: Socially Isolated (01/06/2022)   Social Connection and Isolation Panel [NHANES]    Frequency of Communication with Friends and Family: More than three times a week    Frequency of Social Gatherings with Friends and Family: More than three times a week    Attends Religious Services: Never    Database administrator or Organizations: No    Attends Banker Meetings: Never    Marital Status: Widowed  Intimate Partner Violence: Not At Risk (01/06/2022)  Humiliation, Afraid, Rape, and Kick questionnaire    Fear of Current or Ex-Partner: No    Emotionally Abused: No    Physically Abused: No    Sexually Abused: No    Past Surgical History:  Procedure Laterality Date   ABDOMINAL AORTOGRAM W/LOWER EXTREMITY N/A 02/23/2022   Procedure: ABDOMINAL AORTOGRAM W/LOWER EXTREMITY;  Surgeon: Runell Gess, MD;  Location: [facility];  Service: Cardiovascular;  Laterality: N/A;   ANKLE SURGERY     bilateral   CYSTOSCOPY     with dilation   GREEN LIGHT LASER TURP (TRANSURETHRAL RESECTION OF PROSTATE     INSERTION OF MESH  11/02/2022   Procedure: INSERTION OF MESH;  Surgeon: Axel Filler, MD;  Location: [facility] OR;  Service: General;;   PROSTATE BIOPSY     RADIOACTIVE SEED IMPLANT N/A  08/28/2019   Procedure: RADIOACTIVE SEED IMPLANT/BRACHYTHERAPY IMPLANT;  Surgeon: Bjorn Pippin, MD;  Location: [facility];  Service: Urology;  Laterality: N/A;   SPACE OAR INSTILLATION N/A 08/28/2019   Procedure: SPACE OAR INSTILLATION;  Surgeon: Bjorn Pippin, MD;  Location: [facility];  Service: Urology;  Laterality: N/A;   TONSILLECTOMY     UMBILICAL HERNIA REPAIR N/A 11/02/2022   Procedure: LAPAROSCOPIC UMBILICAL HERNIA  REPAIR WITH MESH;  Surgeon: Axel Filler, MD;  Location: [facility] OR;  Service: General;  Laterality: N/A;    Family History  Problem Relation Age of Onset   Bone cancer Mother    Bladder Cancer Sister    Rectal cancer Brother    Bladder Cancer Brother    Diabetes Neg Hx    Heart disease Neg Hx    Hyperlipidemia Neg Hx    Cancer Neg Hx    Breast cancer Neg Hx    Pancreatic cancer Neg Hx     No Known Allergies  Current Outpatient Medications on File Prior to Visit  Medication Sig Dispense Refill   acetaminophen (TYLENOL) 500 MG tablet Take 1,000 mg by mouth every 6 (six) hours as needed for moderate pain.     amLODipine (NORVASC) 5 MG tablet TAKE 1 AND 1/2 TABLET BY MOUTH DAILY 135 tablet 2   aspirin 81 MG EC tablet Take 1 tablet (81 mg total) by mouth daily. Swallow whole. 30 tablet 12   atorvastatin (LIPITOR) 80 MG tablet Take 1 tablet (80 mg total) by mouth daily. 30 tablet 8   Boswellia-Glucosamine-Vit D (OSTEO BI-FLEX ONE PER DAY PO) Take 1 tablet by mouth daily.     cilostazol (PLETAL) 50 MG tablet TAKE 1 TABLET BY MOUTH TWICE A DAY 180 tablet 1   loratadine-pseudoephedrine (CLARITIN-D 24-HOUR) 10-240 MG 24 hr tablet Take 1 tablet by mouth daily as needed for allergies.     Multiple Vitamins-Minerals (MENS MULTIVITAMIN) TABS Take 1 tablet by mouth daily.     Multiple Vitamins-Minerals (PRESERVISION AREDS 2 PO) Take 1 tablet by mouth in the morning and at bedtime.     silodosin (RAPAFLO) 8 MG CAPS capsule Take 8 mg by mouth daily with  breakfast.     traMADol (ULTRAM) 50 MG tablet Take 1 tablet (50 mg total) by mouth every 6 (six) hours as needed. 20 tablet 0   No current facility-administered medications on file prior to visit.    BP (!) 140/52   Pulse 68   Temp 97.9 F (36.6 C) (Oral)   Ht 6' (1.829 m)   Wt 210 lb (95.3 kg)   SpO2 97%   BMI 28.48 kg/m  Objective:   Physical Exam Vitals and nursing note reviewed.  Constitutional:      Appearance: Normal appearance.  Cardiovascular:     Rate and Rhythm: Normal rate and regular rhythm.     Pulses:          Popliteal pulses are 2+ on the right side and 1+ on the left side.       Dorsalis pedis pulses are 2+ on the right side and 1+ on the left side.       Posterior tibial pulses are 2+ on the right side and 1+ on the left side.     Heart sounds: Normal heart sounds.     Comments: No calf pain, redness, or warmth Pulmonary:     Effort: Pulmonary effort is normal.     Breath sounds: Normal breath sounds.  Musculoskeletal:        General: Swelling present. Normal range of motion.     Right lower leg: 2+ Pitting Edema (dorsal aspect of foot to mid shin) present.     Left lower leg: 2+ Pitting Edema (dorsal aspect of foot to mid shin) present.  Skin:    General: Skin is warm and dry.  Neurological:     General: No focal deficit present.     Mental Status: He is alert and oriented to person, place, and time.  Psychiatric:        Mood and Affect: Mood normal.        Behavior: Behavior normal.        Thought Content: Thought content normal.        Judgment: Judgment normal.       Assessment & Plan:  1. Lower extremity edema -Start him on Lasix 20 mg daily.  Will will have him take this for about a week and then follow-up with his PCP for further management.  Was encouraged low-sodium diet, elevate legs while at rest, do some leg exercises such as a stationary bicycle and stay hydrated. - furosemide (LASIX) 20 MG tablet; Take 1 tablet (20 mg total)  by mouth daily.  Dispense: 10 tablet; Refill: 0  2. PAD (peripheral artery disease) (HCC)  - furosemide (LASIX) 20 MG tablet; Take 1 tablet (20 mg total) by mouth daily.  Dispense: 10 tablet; Refill: 0  Shirline Frees, NP  Time spent with patient today was 31 minutes which consisted of chart review, discussing lower extremity edema, PAD,  work up, treatment answering questions and documentation.

## 2023-06-18 ENCOUNTER — Ambulatory Visit (INDEPENDENT_AMBULATORY_CARE_PROVIDER_SITE_OTHER): Payer: Medicare Other | Admitting: Family Medicine

## 2023-06-18 ENCOUNTER — Encounter: Payer: Self-pay | Admitting: Family Medicine

## 2023-06-18 VITALS — BP 136/80 | HR 82 | Resp 16 | Ht 72.0 in | Wt 206.5 lb

## 2023-06-18 DIAGNOSIS — I872 Venous insufficiency (chronic) (peripheral): Secondary | ICD-10-CM

## 2023-06-18 DIAGNOSIS — I739 Peripheral vascular disease, unspecified: Secondary | ICD-10-CM | POA: Diagnosis not present

## 2023-06-18 DIAGNOSIS — F1020 Alcohol dependence, uncomplicated: Secondary | ICD-10-CM

## 2023-06-18 DIAGNOSIS — I10 Essential (primary) hypertension: Secondary | ICD-10-CM

## 2023-06-18 DIAGNOSIS — R600 Localized edema: Secondary | ICD-10-CM | POA: Diagnosis not present

## 2023-06-18 MED ORDER — FUROSEMIDE 20 MG PO TABS
20.0000 mg | ORAL_TABLET | Freq: Every day | ORAL | 2 refills | Status: DC
Start: 2023-06-18 — End: 2023-09-17

## 2023-06-18 NOTE — Patient Instructions (Addendum)
A few things to remember from today's visit:  PAD (peripheral artery disease) (HCC)  Bilateral lower extremity edema - Plan: Basic metabolic panel, Hepatic function panel, furosemide (LASIX) 20 MG tablet  Venous stasis dermatitis of both lower extremities  Alcohol dependence, uncomplicated (HCC) - Plan: Hepatic function panel No changes today. Lower extremities elevation a few times during the day. Continue working on decreasing alcohol intake.  If you need refills for medications you take chronically, please call your pharmacy. Do not use My Chart to request refills or for acute issues that need immediate attention. If you send a my chart message, it may take a few days to be addressed, specially if I am not in the office.  Please be sure medication list is accurate. If a new problem present, please set up appointment sooner than planned today.

## 2023-06-18 NOTE — Progress Notes (Signed)
HPI: [name] is a 77 y.o. male with a PMHx significant for HTN, PAD, prostate cancer, umbilical hernia without obstruction and without gangrene, and HLD, who is here today with his daughter for follow up and chronic disease management.  Last seen on 09/12/2022  Lower extremity edema:  He was seen here on 11/21 by Shirline Frees for edema and given furosemide 20 mg to take daily. He has taken 4 pills so far, which he says has helped somewhat, but swelling is still present. His daughter thinks it has been a great improvement. He mentions he has had some SOB after with activity, no more than his usual. Denies orthopnea or PND.  Edema worse at the end of the day.  Sleeping on a recliner, so he can keep LE's elevated.  PAD: He denies chest pain, gross hematuria, foam in his urine, claudication like symptoms, or ulcers.  He is on Pletal 50 mg bid, Atorvastatin 80 mg,and Aspirin 81 mg daily. Lab Results  Component Value Date   CHOL 177 06/22/2022   HDL 90 06/22/2022   LDLCALC 71 06/22/2022   TRIG 89 06/22/2022   CHOLHDL 2.0 06/22/2022   Hypertension:  Medications: Currently on amlodipine 7.5 mg daily.  Negative for unusual or severe headache, visual changes, exertional chest pain, or focal weakness.  Lab Results  Component Value Date   CREATININE 1.16 10/26/2022   BUN 19 10/26/2022   NA 137 10/26/2022   K 4.5 10/26/2022   CL 104 10/26/2022   CO2 23 10/26/2022   Alcohol:  He admits to frequent alcohol intake, daily.   Review of Systems  Constitutional:  Negative for activity change, appetite change, chills and fever.  HENT:  Negative for mouth sores and sore throat.   Respiratory:  Negative for cough and wheezing.   Gastrointestinal:  Negative for abdominal pain, nausea and vomiting.  Genitourinary:  Negative for decreased urine volume, dysuria and hematuria.  Skin:  Negative for rash.  Neurological:  Negative for syncope and facial asymmetry.  Psychiatric/Behavioral:   Negative for confusion and hallucinations.   See other pertinent positives and negatives in HPI.  Current Outpatient Medications on File Prior to Visit  Medication Sig Dispense Refill   acetaminophen (TYLENOL) 500 MG tablet Take 1,000 mg by mouth every 6 (six) hours as needed for moderate pain.     amLODipine (NORVASC) 5 MG tablet TAKE 1 AND 1/2 TABLET BY MOUTH DAILY 135 tablet 2   aspirin 81 MG EC tablet Take 1 tablet (81 mg total) by mouth daily. Swallow whole. 30 tablet 12   atorvastatin (LIPITOR) 80 MG tablet Take 1 tablet (80 mg total) by mouth daily. 30 tablet 8   Boswellia-Glucosamine-Vit D (OSTEO BI-FLEX ONE PER DAY PO) Take 1 tablet by mouth daily.     cilostazol (PLETAL) 50 MG tablet TAKE 1 TABLET BY MOUTH TWICE A DAY 180 tablet 1   furosemide (LASIX) 20 MG tablet Take 1 tablet (20 mg total) by mouth daily. 10 tablet 0   loratadine-pseudoephedrine (CLARITIN-D 24-HOUR) 10-240 MG 24 hr tablet Take 1 tablet by mouth daily as needed for allergies.     Multiple Vitamins-Minerals (MENS MULTIVITAMIN) TABS Take 1 tablet by mouth daily.     Multiple Vitamins-Minerals (PRESERVISION AREDS 2 PO) Take 1 tablet by mouth in the morning and at bedtime.     silodosin (RAPAFLO) 8 MG CAPS capsule Take 8 mg by mouth daily with breakfast.     traMADol (ULTRAM) 50 MG tablet Take 1  tablet (50 mg total) by mouth every 6 (six) hours as needed. 20 tablet 0   No current facility-administered medications on file prior to visit.    Past Medical History:  Diagnosis Date   Hypertension    PAD (peripheral artery disease) (HCC)    Prostate cancer (HCC)    No Known Allergies  Social History   Socioeconomic History   Marital status: Widowed    Spouse name: Not on file   Number of children: 2   Years of education: Not on file   Highest education level: Not on file  Occupational History   Occupation: Chartered certified accountant    Comment: retired  Tobacco Use   Smoking status: Former    Current packs/day: 0.00     Average packs/day: 1 pack/day for 60.0 years (60.0 ttl pk-yrs)    Types: Cigarettes    Start date: 07/25/1959    Quit date: 10/07/2015    Years since quitting: 7.7   Smokeless tobacco: Never  Vaping Use   Vaping status: Never Used  Substance and Sexual Activity   Alcohol use: Yes    Comment: occassionally   Drug use: Not Currently   Sexual activity: Not Currently  Other Topics Concern   Not on file  Social History Narrative   Not on file   Social Determinants of Health   Financial Resource Strain: Low Risk  (01/06/2022)   Overall Financial Resource Strain (CARDIA)    Difficulty of Paying Living Expenses: Not hard at all  Food Insecurity: No Food Insecurity (01/06/2022)   Hunger Vital Sign    Worried About Running Out of Food in the Last Year: Never true    Ran Out of Food in the Last Year: Never true  Transportation Needs: No Transportation Needs (01/06/2022)   PRAPARE - Administrator, Civil Service (Medical): No    Lack of Transportation (Non-Medical): No  Physical Activity: Sufficiently Active (01/06/2022)   Exercise Vital Sign    Days of Exercise per Week: 7 days    Minutes of Exercise per Session: 150+ min  Stress: No Stress Concern Present (01/06/2022)   [employer] of Occupational Health - Occupational Stress Questionnaire    Feeling of Stress : Not at all  Social Connections: Socially Isolated (01/06/2022)   Social Connection and Isolation Panel [NHANES]    Frequency of Communication with Friends and Family: More than three times a week    Frequency of Social Gatherings with Friends and Family: More than three times a week    Attends Religious Services: Never    Database administrator or Organizations: No    Attends Banker Meetings: Never    Marital Status: Widowed   Today's Vitals   06/18/23 1445  BP: 136/80  Pulse: 82  Resp: 16  SpO2: 98%  Weight: 206 lb 8 oz (93.7 kg)  Height: 6' (1.829 m)   Body mass index is 28.01  kg/m.  Physical Exam Vitals and nursing note reviewed.  Constitutional:      General: He is not in acute distress.    Appearance: He is well-developed.  HENT:     Head: Normocephalic and atraumatic.     Mouth/Throat:     Mouth: Mucous membranes are moist.  Eyes:     Conjunctiva/sclera: Conjunctivae normal.  Cardiovascular:     Rate and Rhythm: Normal rate and regular rhythm.     Heart sounds: No murmur heard.    Comments: Bilateral LE and pedal, 1-2+ pitting  edema DP pulses palpable; hard to find, normal capillary refill Pulmonary:     Effort: Pulmonary effort is normal. No respiratory distress.     Breath sounds: Normal breath sounds.  Abdominal:     Palpations: Abdomen is soft. There is no mass.     Tenderness: There is no abdominal tenderness.  Musculoskeletal:     Right lower leg: Pitting Edema present.     Left lower leg: Pitting Edema present.  Lymphadenopathy:     Cervical: No cervical adenopathy.  Skin:    General: Skin is warm.     Findings: No erythema or rash.     Comments: Distal LE's pigmentation changes, bilateral.  Neurological:     Mental Status: He is alert and oriented to person, place, and time.     Cranial Nerves: No cranial nerve deficit.     Gait: Gait normal.     Comments: Mildly unstable gait, not assisted.  Psychiatric:        Mood and Affect: Mood and affect normal.    ASSESSMENT AND PLAN:  Mr. Mance was seen today for chronic disease management.  Lab Results  Component Value Date   NA 139 06/18/2023   CL 103 06/18/2023   K 4.5 06/18/2023   CO2 27 06/18/2023   BUN 23 06/18/2023   CREATININE 1.26 06/18/2023   GFR 55.35 (L) 06/18/2023   CALCIUM 9.7 06/18/2023   ALBUMIN 4.3 06/18/2023   GLUCOSE 110 (H) 06/18/2023   Lab Results  Component Value Date   ALT 20 06/18/2023   AST 16 06/18/2023   ALKPHOS 121 (H) 06/18/2023   BILITOT 0.7 06/18/2023   Bilateral lower extremity edema Improved. Amlodipine may aggravate problem. We  discussed some side effects of Furosemide, recommend continuing 20 mg daily. LE elevation a few times through the day.  -     Basic metabolic panel; Future -     Hepatic function panel; Future -     Furosemide; Take 1 tablet (20 mg total) by mouth daily.  Dispense: 30 tablet; Refill: 2  PAD (peripheral artery disease) (HCC) Stable. Currently on Atorvastatin 80 mg daily, Pletal 50 mg twice daily, and aspirin 81 mg daily. Continue appropriate foot care. Following with vascular.  Venous stasis dermatitis of both lower extremities We discussed Dx, prognosis,and treatment. He has no pruritus. Appropriate skin care.  Alcohol dependence, uncomplicated (HCC) He drinks daily and mainly beer. He understands adverse effects, working on decreasing amount.  -     Hepatic function panel; Future  Hypertension, essential, benign In general BP adequately controlled. For now no change sin Amlodipine dose, if edema worsens, we could consider decreasing dose and add a thiazide, chlorthalidone. Low salt diet to continue.  -Declined lung cancer screening.  Return in about 6 months (around 12/16/2023).  I, Suanne Marker, acting as a scribe for Shermika Balthaser [place], MD., have documented all relevant documentation on the behalf of Eulonda Andalon [place], MD, as directed by  Sirus Labrie [place], MD while in the presence of Mavin Dyke [place], MD.   I, Suanne Marker, have reviewed all documentation for this visit. The documentation on 06/18/23 for the exam, diagnosis, procedures, and orders are all accurate and complete.  Shyloh Derosa G. [place], MD  [facility]. [place] office.

## 2023-06-19 LAB — HEPATIC FUNCTION PANEL
ALT: 20 U/L (ref 0–53)
AST: 16 U/L (ref 0–37)
Albumin: 4.3 g/dL (ref 3.5–5.2)
Alkaline Phosphatase: 121 U/L — ABNORMAL HIGH (ref 39–117)
Bilirubin, Direct: 0.2 mg/dL (ref 0.0–0.3)
Total Bilirubin: 0.7 mg/dL (ref 0.2–1.2)
Total Protein: 6.5 g/dL (ref 6.0–8.3)

## 2023-06-19 LAB — BASIC METABOLIC PANEL
BUN: 23 mg/dL (ref 6–23)
CO2: 27 meq/L (ref 19–32)
Calcium: 9.7 mg/dL (ref 8.4–10.5)
Chloride: 103 meq/L (ref 96–112)
Creatinine, Ser: 1.26 mg/dL (ref 0.40–1.50)
GFR: 55.35 mL/min — ABNORMAL LOW (ref >60.00)
Glucose, Bld: 110 mg/dL — ABNORMAL HIGH (ref 70–99)
Potassium: 4.5 meq/L (ref 3.5–5.1)
Sodium: 139 meq/L (ref 135–145)

## 2023-06-27 ENCOUNTER — Telehealth: Payer: Self-pay | Admitting: Family Medicine

## 2023-06-27 NOTE — Telephone Encounter (Signed)
See result note.  

## 2023-06-27 NOTE — Telephone Encounter (Signed)
Pt daughter is calling back and would like blood work result

## 2023-06-28 ENCOUNTER — Encounter: Payer: Medicare Other | Admitting: Family Medicine

## 2023-06-29 ENCOUNTER — Other Ambulatory Visit: Payer: Self-pay | Admitting: Cardiovascular Disease

## 2023-06-29 DIAGNOSIS — I739 Peripheral vascular disease, unspecified: Secondary | ICD-10-CM

## 2023-06-29 DIAGNOSIS — E785 Hyperlipidemia, unspecified: Secondary | ICD-10-CM

## 2023-07-22 ENCOUNTER — Other Ambulatory Visit: Payer: Self-pay | Admitting: Family Medicine

## 2023-07-22 DIAGNOSIS — I10 Essential (primary) hypertension: Secondary | ICD-10-CM

## 2023-09-10 ENCOUNTER — Other Ambulatory Visit: Payer: Self-pay | Admitting: Cardiovascular Disease

## 2023-09-17 ENCOUNTER — Other Ambulatory Visit: Payer: Self-pay | Admitting: Family Medicine

## 2023-09-17 DIAGNOSIS — R600 Localized edema: Secondary | ICD-10-CM

## 2023-09-24 ENCOUNTER — Other Ambulatory Visit: Payer: Self-pay | Admitting: Cardiovascular Disease

## 2023-09-29 ENCOUNTER — Other Ambulatory Visit: Payer: Self-pay | Admitting: Cardiovascular Disease

## 2023-09-29 DIAGNOSIS — I739 Peripheral vascular disease, unspecified: Secondary | ICD-10-CM

## 2023-09-29 DIAGNOSIS — E785 Hyperlipidemia, unspecified: Secondary | ICD-10-CM

## 2023-10-01 ENCOUNTER — Other Ambulatory Visit: Payer: Self-pay

## 2023-10-01 DIAGNOSIS — E785 Hyperlipidemia, unspecified: Secondary | ICD-10-CM

## 2023-10-01 DIAGNOSIS — I739 Peripheral vascular disease, unspecified: Secondary | ICD-10-CM

## 2023-10-01 MED ORDER — ATORVASTATIN CALCIUM 80 MG PO TABS
80.0000 mg | ORAL_TABLET | Freq: Every day | ORAL | 0 refills | Status: DC
Start: 2023-10-01 — End: 2023-10-22

## 2023-10-01 NOTE — Telephone Encounter (Signed)
 Pt's medication was sent to pt's pharmacy as requested. Confirmation received.

## 2023-10-16 ENCOUNTER — Encounter: Payer: Self-pay | Admitting: Cardiovascular Disease

## 2023-10-16 ENCOUNTER — Ambulatory Visit: Attending: Cardiovascular Disease | Admitting: Cardiovascular Disease

## 2023-10-16 VITALS — BP 138/70 | HR 71 | Ht 72.0 in | Wt 205.8 lb

## 2023-10-16 DIAGNOSIS — I739 Peripheral vascular disease, unspecified: Secondary | ICD-10-CM

## 2023-10-16 DIAGNOSIS — R931 Abnormal findings on diagnostic imaging of heart and coronary circulation: Secondary | ICD-10-CM

## 2023-10-16 DIAGNOSIS — E785 Hyperlipidemia, unspecified: Secondary | ICD-10-CM | POA: Diagnosis not present

## 2023-10-16 DIAGNOSIS — I10 Essential (primary) hypertension: Secondary | ICD-10-CM

## 2023-10-16 LAB — LIPID PANEL
Chol/HDL Ratio: 1.9 ratio (ref 0.0–5.0)
Cholesterol, Total: 159 mg/dL (ref 100–199)
HDL: 82 mg/dL (ref >39)
LDL Chol Calc (NIH): 62 mg/dL (ref 0–99)
Triglycerides: 79 mg/dL (ref 0–149)
VLDL Cholesterol Cal: 15 mg/dL (ref 5–40)

## 2023-10-16 LAB — HEPATIC FUNCTION PANEL
ALT: 18 IU/L (ref 0–44)
AST: 16 IU/L (ref 0–40)
Albumin: 4.5 g/dL (ref 3.8–4.8)
Alkaline Phosphatase: 115 IU/L (ref 44–121)
Bilirubin Total: 1 mg/dL (ref 0.0–1.2)
Bilirubin, Direct: 0.39 mg/dL (ref 0.00–0.40)
Total Protein: 6.5 g/dL (ref 6.0–8.5)

## 2023-10-16 NOTE — Assessment & Plan Note (Signed)
 History of essential hypertension her blood pressure measured today at 138/70.  He is on amlodipine.

## 2023-10-16 NOTE — Assessment & Plan Note (Signed)
 History of hyperlipidemia on statin therapy with lipid profile performed 06/22/2022 revealing total cholesterol 177, LDL of 71 and HDL of 90.  Will repeat a lipid liver profile today.

## 2023-10-16 NOTE — Assessment & Plan Note (Signed)
 History of PAD with claudication.  I performed peripheral angiography on him 02/23/2022.  He had a long calcified mid left SFA CTO with one-vessel runoff via posterior tibial that I was unable to cross.  He had 70% segmental mid right SFA with one-vessel runoff via peroneal.  I placed him on cilostazol which resulted in marked improvement in his claudication which is currently now not lifestyle limiting.

## 2023-10-16 NOTE — Progress Notes (Signed)
 10/16/2023 Dene Gentry   03/30/1946  [id number]  Primary Physician [place], Betty G, MD Primary Cardiologist: Runell Gess MD Nicholes Calamity, [place]  HPI:  Terry Morales is a 78 y.o.  mildly overweight widowed Caucasian male father of 2, grandfather of 3 grandchildren who is accompanied by his daughter Lawson Fiscal today.  He was referred by Dr. Betty [place], his PCP, for evaluation and treatment of PAD.  He is a retired Chartered certified accountant.  I last saw him in the office 06/28/2022.  His cardiovascular risk factor profile is notable for 55 years of tobacco abuse having quit 5 years ago, treated hypertension hyperlipidemia.  There is no family history for heart disease.  Is never had a heart attack or stroke.  He denies chest pain or shortness of breath.  He has had lower extremity claudication left greater than right for over a year with recent Doppler studies performed 01/11/2022 revealing a right ABI of 0.81 with a moderate lesion in the mid right SFA and a left ABI of 0.56 with an occluded distal left SFA.   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  I was unsuccessful at crossing his CTO and the procedure was aborted.  He is discharged home the following day.  He did have some junctional tachycardia and frequent PVCs on telemetry for which a Zio patch was ordered which however he is never put this on and he is totally unaware of these.  I did do a coronary calcium score on him 02/21/2022 which was 1397 which led to a Lexiscan the following day that was complete normal with an EF of 50%.   Since I saw him in the office a little over a year ago he continues to do well.  He walks 4000 steps a day and rides 2 miles on a recumbent bike daily.  Does complain of some dyspnea but denies chest pain.  He also denies claudication.  He remains on cilostazol.   Current Meds  Medication  Sig   acetaminophen (TYLENOL) 500 MG tablet Take 1,000 mg by mouth every 6 (six) hours as needed for moderate pain.   amLODipine (NORVASC) 5 MG tablet TAKE 1 AND 1/2 TABLET BY MOUTH DAILY   aspirin 81 MG EC tablet Take 1 tablet (81 mg total) by mouth daily. Swallow whole.   atorvastatin (LIPITOR) 80 MG tablet Take 1 tablet (80 mg total) by mouth daily.   Boswellia-Glucosamine-Vit D (OSTEO BI-FLEX ONE PER DAY PO) Take 1 tablet by mouth daily.   cilostazol (PLETAL) 50 MG tablet TAKE 1 TABLET BY MOUTH 2 TIMES A DAY   furosemide (LASIX) 20 MG tablet TAKE 1 TABLET BY MOUTH DAILY   loratadine-pseudoephedrine (CLARITIN-D 24-HOUR) 10-240 MG 24 hr tablet Take 1 tablet by mouth daily as needed for allergies.   Multiple Vitamins-Minerals (MENS MULTIVITAMIN) TABS Take 1 tablet by mouth daily.   Multiple Vitamins-Minerals (PRESERVISION AREDS 2 PO) Take 1 tablet by mouth in the morning and at bedtime.   silodosin (RAPAFLO) 8 MG CAPS capsule Take 8 mg by mouth daily with breakfast.   traMADol (ULTRAM) 50 MG tablet Take 1 tablet (50 mg total) by mouth every 6 (six) hours as needed.     No Known Allergies  Social History   Socioeconomic History   Marital status: Widowed    Spouse name: Not on file   Number  of children: 2   Years of education: Not on file   Highest education level: Not on file  Occupational History   Occupation: Chartered certified accountant    Comment: retired  Tobacco Use   Smoking status: Former    Current packs/day: 0.00    Average packs/day: 1 pack/day for 60.0 years (60.0 ttl pk-yrs)    Types: Cigarettes    Start date: 07/25/1959    Quit date: 10/07/2015    Years since quitting: 8.0   Smokeless tobacco: Never  Vaping Use   Vaping status: Never Used  Substance and Sexual Activity   Alcohol use: Yes    Comment: occassionally   Drug use: Not Currently   Sexual activity: Not Currently  Other Topics Concern   Not on file  Social History Narrative   Not on file   Social Drivers of Health    Financial Resource Strain: Low Risk  (01/06/2022)   Overall Financial Resource Strain (CARDIA)    Difficulty of Paying Living Expenses: Not hard at all  Food Insecurity: No Food Insecurity (01/06/2022)   Hunger Vital Sign    Worried About Running Out of Food in the Last Year: Never true    Ran Out of Food in the Last Year: Never true  Transportation Needs: No Transportation Needs (01/06/2022)   PRAPARE - Administrator, Civil Service (Medical): No    Lack of Transportation (Non-Medical): No  Physical Activity: Sufficiently Active (01/06/2022)   Exercise Vital Sign    Days of Exercise per Week: 7 days    Minutes of Exercise per Session: 150+ min  Stress: No Stress Concern Present (01/06/2022)   [employer] of Occupational Health - Occupational Stress Questionnaire    Feeling of Stress : Not at all  Social Connections: Socially Isolated (01/06/2022)   Social Connection and Isolation Panel [NHANES]    Frequency of Communication with Friends and Family: More than three times a week    Frequency of Social Gatherings with Friends and Family: More than three times a week    Attends Religious Services: Never    Database administrator or Organizations: No    Attends Banker Meetings: Never    Marital Status: Widowed  Intimate Partner Violence: Not At Risk (01/06/2022)   Humiliation, Afraid, Rape, and Kick questionnaire    Fear of Current or Ex-Partner: No    Emotionally Abused: No    Physically Abused: No    Sexually Abused: No     Review of Systems: General: negative for chills, fever, night sweats or weight changes.  Cardiovascular: negative for chest pain, dyspnea on exertion, edema, orthopnea, palpitations, paroxysmal nocturnal dyspnea or shortness of breath Dermatological: negative for rash Respiratory: negative for cough or wheezing Urologic: negative for hematuria Abdominal: negative for nausea, vomiting, diarrhea, bright red blood per rectum, melena,  or hematemesis Neurologic: negative for visual changes, syncope, or dizziness All other systems reviewed and are otherwise negative except as noted above.    Blood pressure 138/70, pulse 71, height 6' (1.829 m), weight 205 lb 12.8 oz (93.4 kg), SpO2 96%.  General appearance: alert and no distress Neck: no adenopathy, no carotid bruit, no JVD, supple, symmetrical, trachea midline, and thyroid not enlarged, symmetric, no tenderness/mass/nodules Lungs: clear to auscultation bilaterally Heart: regular rate and rhythm, S1, S2 normal, no murmur, click, rub or gallop Extremities: extremities normal, atraumatic, no cyanosis or edema Pulses: Diminished pedal pulses Skin: Skin color, texture, turgor normal. No rashes or lesions Neurologic: Grossly  normal  EKG EKG Interpretation Date/Time:  Tuesday October 16 2023 10:46:20 EDT Ventricular Rate:  71 PR Interval:  196 QRS Duration:  96 QT Interval:  400 QTC Calculation: 434 R Axis:   69  Text Interpretation: Sinus rhythm with occasional Premature ventricular complexes Nonspecific ST and T wave abnormality When compared with ECG of 23-Feb-2022 10:13, Sinus rhythm has replaced Junctional rhythm Vent. rate has decreased BY  57 BPM Borderline criteria for Inferior infarct are no longer Present ST no longer depressed in Anterior leads Confirmed by Nanetta Batty [phone]) on 10/16/2023 11:20:23 AM    ASSESSMENT AND PLAN:   Hypertension, essential, benign History of essential hypertension her blood pressure measured today at 138/70.  He is on amlodipine.  PAD (peripheral artery disease) (HCC) History of PAD with claudication.  I performed peripheral angiography on him 02/23/2022.  He had a long calcified mid left SFA CTO with one-vessel runoff via posterior tibial that I was unable to cross.  He had 70% segmental mid right SFA with one-vessel runoff via peroneal.  I placed him on cilostazol which resulted in marked improvement in his claudication which is  currently now not lifestyle limiting.  Hyperlipidemia LDL goal <70 History of hyperlipidemia on statin therapy with lipid profile performed 06/22/2022 revealing total cholesterol 177, LDL of 71 and HDL of 90.  Will repeat a lipid liver profile today.  Elevated coronary artery calcium score Elevated coronary calcium score of 1397 performed 02/21/2022 with a negative Myoview stress test done subsequently.  His EF was 50%.  He denies chest pain.     Runell Gess MD FACP,FACC,FAHA, [facility] 10/16/2023 11:32 AM

## 2023-10-16 NOTE — Patient Instructions (Signed)
 Medication Instructions:  Your physician recommends that you continue on your current medications as directed. Please refer to the Current Medication list given to you today.  *If you need a refill on your cardiac medications before your next appointment, please call your pharmacy*   Lab Work: Your physician recommends that you have labs drawn today: Lipid/liver panel  If you have labs (blood work) drawn today and your tests are completely normal, you will receive your results only by: MyChart Message (if you have MyChart) OR A paper copy in the mail If you have any lab test that is abnormal or we need to change your treatment, we will call you to review the results.    Follow-Up: At [facility], you and your health needs are our priority.  As part of our continuing mission to provide you with exceptional heart care, we have created designated Provider Care Teams.  These Care Teams include your primary Cardiologist (physician) and Advanced Practice Providers (APPs -  Physician Assistants and Nurse Practitioners) who all work together to provide you with the care you need, when you need it.  We recommend signing up for the patient portal called "MyChart".  Sign up information is provided on this After Visit Summary.  MyChart is used to connect with patients for Virtual Visits (Telemedicine).  Patients are able to view lab/test results, encounter notes, upcoming appointments, etc.  Non-urgent messages can be sent to your provider as well.   To learn more about what you can do with MyChart, go to [email or web address].    Your next appointment:   12 month(s)  Provider:   Nanetta Batty, MD    Other Instructions   1st Floor: - Lobby - Registration  - Pharmacy  - Lab - Cafe  2nd Floor: - PV Lab - Diagnostic Testing (echo, CT, nuclear med)  3rd Floor: - Vacant  4th Floor: - TCTS (cardiothoracic surgery) - AFib Clinic - Structural Heart Clinic - Vascular Surgery   - Vascular Ultrasound  5th Floor: - [facility] Cardiology (general and EP) - Clinical Pharmacy for coumadin, hypertension, lipid, weight-loss medications, and med management appointments    Valet parking services will be available as well.

## 2023-10-16 NOTE — Assessment & Plan Note (Signed)
 Elevated coronary calcium score of 1397 performed 02/21/2022 with a negative Myoview stress test done subsequently.  His EF was 50%.  He denies chest pain.

## 2023-10-22 ENCOUNTER — Other Ambulatory Visit: Payer: Self-pay | Admitting: Cardiovascular Disease

## 2023-10-22 DIAGNOSIS — E785 Hyperlipidemia, unspecified: Secondary | ICD-10-CM

## 2023-10-22 DIAGNOSIS — I739 Peripheral vascular disease, unspecified: Secondary | ICD-10-CM

## 2023-11-21 ENCOUNTER — Other Ambulatory Visit: Payer: Self-pay | Admitting: Family Medicine

## 2023-11-21 ENCOUNTER — Other Ambulatory Visit: Payer: Self-pay | Admitting: Urology

## 2023-11-21 DIAGNOSIS — I10 Essential (primary) hypertension: Secondary | ICD-10-CM

## 2024-02-26 ENCOUNTER — Encounter: Payer: Self-pay | Admitting: Family Medicine

## 2024-02-26 ENCOUNTER — Ambulatory Visit (INDEPENDENT_AMBULATORY_CARE_PROVIDER_SITE_OTHER): Admitting: Family Medicine

## 2024-02-26 VITALS — BP 120/70 | HR 94 | Resp 16 | Ht 72.0 in | Wt 209.5 lb

## 2024-02-26 DIAGNOSIS — C61 Malignant neoplasm of prostate: Secondary | ICD-10-CM

## 2024-02-26 DIAGNOSIS — M79672 Pain in left foot: Secondary | ICD-10-CM

## 2024-02-26 DIAGNOSIS — I10 Essential (primary) hypertension: Secondary | ICD-10-CM

## 2024-02-26 DIAGNOSIS — F1020 Alcohol dependence, uncomplicated: Secondary | ICD-10-CM

## 2024-02-26 MED ORDER — DICLOFENAC SODIUM 1 % EX GEL
2.0000 g | Freq: Four times a day (QID) | CUTANEOUS | 1 refills | Status: AC
Start: 2024-02-26 — End: ?

## 2024-02-26 NOTE — Assessment & Plan Note (Signed)
 BP adequately controlled. Continue amlodipine  5 mg 1.5 tablet daily as well as low-salt diet. Monitor BP regularly. He is seeing vascular and other providers, so he can continue following annually as far as problem is stable.

## 2024-02-26 NOTE — Assessment & Plan Note (Signed)
 Continues drinking alcohol, 3-4 beers daily. Encouraged to decrease alcohol intake.

## 2024-02-26 NOTE — Assessment & Plan Note (Signed)
 PSA 0.1 in 12/2023. Follows with urologist annually.

## 2024-02-26 NOTE — Progress Notes (Signed)
 ACUTE VISIT Chief Complaint  Patient presents with   Foot Problem    Left foot   HPI: [name] is a 78 y.o. male with a PMHx significant for HTN, PAD, prostate cancer, umbilical hernia without obstruction and without gangrene, and HLD, who is here with his daughter today complaining of left outer foot pain, ongoing several months.  He endorses numbness and tingling at baseline, which has worsened since onset of pain.  The pain typically occurs while walking, rated at a 7/10.  He initially believed the pain was related to his shoes. Although his pain did improve when he would buy new shoes, it eventually would reoccur.  His daughter states he experiences pain regardless of the style of shoe, even in his slippers.  He denies any pain when using his recumbent exercise bike.  Of note, he has had multiple ankle surgeries due to 3 different ankle fractures.  Has been taking Ibuprofen for pain relief.  Has not tried topical Voltaren , Icy Hot, or other OTC creams/ointments.  No pain when sitting or at rest.  Denies any known injuries, new skin changes, or changes in appearance.  Discussed the use of AI scribe software for clinical note transcription with the patient, who gave verbal consent to proceed.  History of Present Illness Terry Morales is a 78 year old male with a history of vascular issues who presents with chronic foot pain.  He has been experiencing pain in the lateral aspect of his left foot, specifically at the fifth mid metatarsal, for several months. The pain is rated as 7 out of 10 during walking and does not improve with any type of footwear, including slippers. It is absent at rest but worsens with weight-bearing activities. Changing shoes provided temporary relief, but the pain recurred.  He has a history of multiple foot surgeries, including ankle surgeries, performed a long time ago. He denies any recent trauma or injury to the foot. He experiences numbness and  tingling in the entire foot, which has been worsening alongside the pain.  For pain management, he has been taking ibuprofen. He has not tried any other pain medications or topical treatments.  He has a history of vascular issues, including almost total blockage in one leg, for which he is on blood thinners and is regularly monitored by a vascular specialist.    Review of Systems See other pertinent positives and negatives in HPI.  Current Outpatient Medications on File Prior to Visit  Medication Sig Dispense Refill   acetaminophen  (TYLENOL ) 500 MG tablet Take 1,000 mg by mouth every 6 (six) hours as needed for moderate pain.     amLODipine  (NORVASC ) 5 MG tablet TAKE 1.5 TABLETS BY MOUTH DAILY 135 tablet 1   aspirin  81 MG EC tablet Take 1 tablet (81 mg total) by mouth daily. Swallow whole. 30 tablet 12   atorvastatin  (LIPITOR ) 80 MG tablet TAKE 1 TABLET BY MOUTH DAILY 90 tablet 3   Boswellia-Glucosamine-Vit D (OSTEO BI-FLEX ONE PER DAY PO) Take 1 tablet by mouth daily.     cilostazol  (PLETAL ) 50 MG tablet TAKE 1 TABLET BY MOUTH 2 TIMES A DAY 180 tablet 3   furosemide  (LASIX ) 20 MG tablet TAKE 1 TABLET BY MOUTH DAILY 90 tablet 1   loratadine-pseudoephedrine (CLARITIN-D 24-HOUR) 10-240 MG 24 hr tablet Take 1 tablet by mouth daily as needed for allergies.     Multiple Vitamins-Minerals (MENS MULTIVITAMIN) TABS Take 1 tablet by mouth daily.     Multiple Vitamins-Minerals (PRESERVISION  AREDS 2 PO) Take 1 tablet by mouth in the morning and at bedtime.     silodosin (RAPAFLO) 8 MG CAPS capsule Take 8 mg by mouth daily with breakfast.     No current facility-administered medications on file prior to visit.    Past Medical History:  Diagnosis Date   Hypertension    PAD (peripheral artery disease) (HCC)    Prostate cancer (HCC)    No Known Allergies  Social History   Socioeconomic History   Marital status: Widowed    Spouse name: Not on file   Number of children: 2   Years of  education: Not on file   Highest education level: Not on file  Occupational History   Occupation: Chartered certified accountant    Comment: retired  Tobacco Use   Smoking status: Former    Current packs/day: 0.00    Average packs/day: 1 pack/day for 60.0 years (60.0 ttl pk-yrs)    Types: Cigarettes    Start date: 07/25/1959    Quit date: 10/07/2015    Years since quitting: 8.3   Smokeless tobacco: Never  Vaping Use   Vaping status: Never Used  Substance and Sexual Activity   Alcohol use: Yes    Comment: occassionally   Drug use: Not Currently   Sexual activity: Not Currently  Other Topics Concern   Not on file  Social History Narrative   Not on file   Social Drivers of Health   Financial Resource Strain: Low Risk  (01/06/2022)   Overall Financial Resource Strain (CARDIA)    Difficulty of Paying Living Expenses: Not hard at all  Food Insecurity: No Food Insecurity (01/06/2022)   Hunger Vital Sign    Worried About Running Out of Food in the Last Year: Never true    Ran Out of Food in the Last Year: Never true  Transportation Needs: No Transportation Needs (01/06/2022)   PRAPARE - Administrator, Civil Service (Medical): No    Lack of Transportation (Non-Medical): No  Physical Activity: Sufficiently Active (01/06/2022)   Exercise Vital Sign    Days of Exercise per Week: 7 days    Minutes of Exercise per Session: 150+ min  Stress: No Stress Concern Present (01/06/2022)   [employer] of Occupational Health - Occupational Stress Questionnaire    Feeling of Stress : Not at all  Social Connections: Socially Isolated (01/06/2022)   Social Connection and Isolation Panel    Frequency of Communication with Friends and Family: More than three times a week    Frequency of Social Gatherings with Friends and Family: More than three times a week    Attends Religious Services: Never    Database administrator or Organizations: No    Attends Banker Meetings: Never    Marital  Status: Widowed    Vitals:   02/26/24 1415  BP: 120/70  Pulse: 94  Resp: 16  SpO2: 96%   Body mass index is 28.41 kg/m.  Physical Exam Feet:     Comments: *** bony structure. Callus noted.     ASSESSMENT AND PLAN: [name] was seen today for left foot pain.  There are no diagnoses linked to this encounter.  No follow-ups on file.   I, Vernell Forest, acting as a scribe for Bence Trapp [place], MD., have documented all relevant documentation on the behalf of Terry Dancel [place], MD, as directed by   while in the presence of Sade Mehlhoff [place], MD.  I, Edmonia Gonser [place], MD, have reviewed  all documentation for this visit. The documentation on 02/26/24 for the exam, diagnosis, procedures, and orders are all accurate and complete.   Ramanda Paules G. [place], MD  [facility] [place] office.  Discharge Instructions   None

## 2024-02-26 NOTE — Patient Instructions (Addendum)
 A few things to remember from today's visit:  Foot pain, left - Plan: Ambulatory referral to Podiatry, DG Foot Complete Left Avoid Ibuprofen. Try tylenol  and topical Voltaren .  If you need refills for medications you take chronically, please call your pharmacy. Do not use My Chart to request refills or for acute issues that need immediate attention. If you send a my chart message, it may take a few days to be addressed, specially if I am not in the office.  Please be sure medication list is accurate. If a new problem present, please set up appointment sooner than planned today.

## 2024-02-29 ENCOUNTER — Ambulatory Visit: Payer: Self-pay | Admitting: Family Medicine

## 2024-02-29 ENCOUNTER — Other Ambulatory Visit (INDEPENDENT_AMBULATORY_CARE_PROVIDER_SITE_OTHER)

## 2024-02-29 ENCOUNTER — Ambulatory Visit (INDEPENDENT_AMBULATORY_CARE_PROVIDER_SITE_OTHER)

## 2024-02-29 DIAGNOSIS — M79672 Pain in left foot: Secondary | ICD-10-CM

## 2024-02-29 DIAGNOSIS — I10 Essential (primary) hypertension: Secondary | ICD-10-CM | POA: Diagnosis not present

## 2024-02-29 DIAGNOSIS — M85872 Other specified disorders of bone density and structure, left ankle and foot: Secondary | ICD-10-CM | POA: Diagnosis not present

## 2024-02-29 LAB — BASIC METABOLIC PANEL WITH GFR
BUN: 14 mg/dL (ref 6–23)
CO2: 27 meq/L (ref 19–32)
Calcium: 9.1 mg/dL (ref 8.4–10.5)
Chloride: 99 meq/L (ref 96–112)
Creatinine, Ser: 1.05 mg/dL (ref 0.40–1.50)
GFR: 68.55 mL/min (ref >60.00)
Glucose, Bld: 116 mg/dL — ABNORMAL HIGH (ref 70–99)
Potassium: 4 meq/L (ref 3.5–5.1)
Sodium: 136 meq/L (ref 135–145)

## 2024-02-29 LAB — CBC
HCT: 41 % (ref 39.0–52.0)
Hemoglobin: 13.7 g/dL (ref 13.0–17.0)
MCHC: 33.5 g/dL (ref 30.0–36.0)
MCV: 93.9 fl (ref 78.0–100.0)
Platelets: 295 K/uL (ref 150.0–400.0)
RBC: 4.37 Mil/uL (ref 4.22–5.81)
RDW: 13.5 % (ref 11.5–15.5)
WBC: 7.6 K/uL (ref 4.0–10.5)

## 2024-03-03 ENCOUNTER — Other Ambulatory Visit: Payer: Self-pay | Admitting: Podiatry

## 2024-03-03 ENCOUNTER — Ambulatory Visit (INDEPENDENT_AMBULATORY_CARE_PROVIDER_SITE_OTHER)

## 2024-03-03 ENCOUNTER — Ambulatory Visit: Admitting: Podiatry

## 2024-03-03 DIAGNOSIS — M7752 Other enthesopathy of left foot: Secondary | ICD-10-CM | POA: Diagnosis not present

## 2024-03-03 DIAGNOSIS — Z981 Arthrodesis status: Secondary | ICD-10-CM

## 2024-03-03 DIAGNOSIS — M778 Other enthesopathies, not elsewhere classified: Secondary | ICD-10-CM

## 2024-03-03 NOTE — Progress Notes (Signed)
  Subjective:  Patient ID: Terry Morales, male    DOB: 1946-02-11,  MRN: [id number]  Chief Complaint  Patient presents with   Foot Pain    Left foot pain pt stated that the pain is on the outside of his foot he stated that it just came about no recent injuries     Discussed the use of AI scribe software for clinical note transcription with the patient, who gave verbal consent to proceed.  History of Present Illness Terry Morales is a 78 year old male with a history of ankle fusion who presents with left foot pain. He is accompanied by his daughter.  He experiences pain on the lateral aspect of his left foot for several months, progressively worsening. The pain is tender and worsens when walking barefoot or with socks on hard surfaces. Wearing extra wide shoes provides some relief. There is no recollection of any specific injury. He experiences mild tingling in the foot but no significant swelling beyond his baseline. He has circulatory issues and uses compression socks, which are difficult to manage and often left on for extended periods.  The symptoms seem to have started when she started wearing his compression socks more consistently and not pick them up at night.      Objective:    Physical Exam General: AAO x3, NAD  Dermatological: No open lesions identified this time.  Vascular: Dorsalis Pedis artery and Posterior Tibial artery pedal pulses are decreased bilateral with immedate capillary fill time. There is no pain with calf compression, swelling, warmth, erythema.   Neruologic: Grossly intact via light touch bilateral.   Musculoskeletal: Ankle effusion noted.  Metatarsal base and slight discomfort in the course of the peroneal, lateral foot.  There is no area pinpoint tenderness identified otherwise.  There is no erythema.  No increase in edema compared to baseline the report    No images are attached to the encounter.    Results    Assessment:   1. Capsulitis of left  foot   2. H/O ankle fusion      Plan:  Patient was evaluated and treated and all questions answered.  Assessment and Plan Assessment & Plan Left lateral foot pain due to altered biomechanics post-ankle fusion Chronic left lateral foot pain from altered biomechanics post-ankle fusion.  - Advise use of Voltaren  gel as needed.  This was previously ordered but had not yet picked this up. - Recommend icing to reduce inflammation. - Instruct to wear shoes with cushioning and support, avoid walking barefoot. - Suggest for insoles for her shoes to help decrease pressure. - Advise against wearing compression socks overnight. - Consider custom orthotic inserts if symptoms persist.  Chronic lower extremity edema Chronic lower extremity edema managed with compression socks. Concerns about compression socks aggravating foot pain when worn overnight. - Advise wearing compression socks during the day, remove at night.  Radiology 3 views left foot were obtained.  No evidence of acute fracture.  History of ankle fusion noted.  Metatarsus adductus present.  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    Return if symptoms worsen or fail to improve.   Donnice JONELLE Fees DPM

## 2024-03-19 ENCOUNTER — Other Ambulatory Visit: Payer: Self-pay | Admitting: Family Medicine

## 2024-03-19 DIAGNOSIS — R600 Localized edema: Secondary | ICD-10-CM

## 2024-07-21 ENCOUNTER — Other Ambulatory Visit: Payer: Self-pay | Admitting: Family Medicine

## 2024-07-21 DIAGNOSIS — I10 Essential (primary) hypertension: Secondary | ICD-10-CM
